# Patient Record
Sex: Male | Born: 1950 | Race: White | Hispanic: No | Marital: Married | Smoking: Former smoker
Health system: Southern US, Community
[De-identification: ages and names within clinical notes are randomized; demographics above are authoritative.]

## PROBLEM LIST (undated history)

## (undated) DIAGNOSIS — E119 Type 2 diabetes mellitus without complications: Secondary | ICD-10-CM

## (undated) DIAGNOSIS — M1990 Unspecified osteoarthritis, unspecified site: Secondary | ICD-10-CM

## (undated) DIAGNOSIS — N400 Enlarged prostate without lower urinary tract symptoms: Secondary | ICD-10-CM

## (undated) DIAGNOSIS — T7840XA Allergy, unspecified, initial encounter: Secondary | ICD-10-CM

## (undated) DIAGNOSIS — R0600 Dyspnea, unspecified: Secondary | ICD-10-CM

## (undated) DIAGNOSIS — F32A Depression, unspecified: Secondary | ICD-10-CM

## (undated) DIAGNOSIS — K829 Disease of gallbladder, unspecified: Secondary | ICD-10-CM

## (undated) DIAGNOSIS — N529 Male erectile dysfunction, unspecified: Secondary | ICD-10-CM

## (undated) DIAGNOSIS — K449 Diaphragmatic hernia without obstruction or gangrene: Secondary | ICD-10-CM

## (undated) DIAGNOSIS — R351 Nocturia: Secondary | ICD-10-CM

## (undated) DIAGNOSIS — F419 Anxiety disorder, unspecified: Secondary | ICD-10-CM

## (undated) DIAGNOSIS — E785 Hyperlipidemia, unspecified: Secondary | ICD-10-CM

## (undated) DIAGNOSIS — J189 Pneumonia, unspecified organism: Secondary | ICD-10-CM

## (undated) DIAGNOSIS — D649 Anemia, unspecified: Secondary | ICD-10-CM

## (undated) DIAGNOSIS — R110 Nausea: Secondary | ICD-10-CM

## (undated) DIAGNOSIS — K219 Gastro-esophageal reflux disease without esophagitis: Secondary | ICD-10-CM

## (undated) DIAGNOSIS — G4700 Insomnia, unspecified: Secondary | ICD-10-CM

## (undated) DIAGNOSIS — R109 Unspecified abdominal pain: Secondary | ICD-10-CM

## (undated) HISTORY — DX: Hyperlipidemia, unspecified: E78.5

## (undated) HISTORY — DX: Allergy, unspecified, initial encounter: T78.40XA

## (undated) HISTORY — DX: Unspecified abdominal pain: R10.9

## (undated) HISTORY — PX: EYE SURGERY: SHX253

## (undated) HISTORY — DX: Insomnia, unspecified: G47.00

## (undated) HISTORY — DX: Anxiety disorder, unspecified: F41.9

## (undated) HISTORY — DX: Diaphragmatic hernia without obstruction or gangrene: K44.9

## (undated) HISTORY — DX: Gastro-esophageal reflux disease without esophagitis: K21.9

---

## 1998-04-16 ENCOUNTER — Ambulatory Visit (HOSPITAL_BASED_OUTPATIENT_CLINIC_OR_DEPARTMENT_OTHER): Admission: RE | Admit: 1998-04-16 | Discharge: 1998-04-16 | Payer: Self-pay | Admitting: Urology

## 1998-10-12 ENCOUNTER — Ambulatory Visit (HOSPITAL_COMMUNITY): Admission: RE | Admit: 1998-10-12 | Discharge: 1998-10-12 | Payer: Self-pay | Admitting: *Deleted

## 1998-10-12 ENCOUNTER — Encounter: Payer: Self-pay | Admitting: *Deleted

## 1998-11-07 ENCOUNTER — Encounter: Admission: RE | Admit: 1998-11-07 | Discharge: 1998-12-26 | Payer: Self-pay | Admitting: Specialist

## 2000-01-16 ENCOUNTER — Ambulatory Visit (HOSPITAL_COMMUNITY): Admission: RE | Admit: 2000-01-16 | Discharge: 2000-01-16 | Payer: Self-pay | Admitting: Gastroenterology

## 2003-03-04 HISTORY — PX: CARDIAC CATHETERIZATION: SHX172

## 2003-09-20 ENCOUNTER — Ambulatory Visit (HOSPITAL_COMMUNITY): Admission: RE | Admit: 2003-09-20 | Discharge: 2003-09-20 | Payer: Self-pay | Admitting: Cardiovascular Disease

## 2004-03-03 HISTORY — PX: TRANSURETHRAL RESECTION OF PROSTATE: SHX73

## 2004-03-03 HISTORY — PX: CHEST TUBE INSERTION: SHX231

## 2004-08-08 ENCOUNTER — Emergency Department (HOSPITAL_COMMUNITY): Admission: EM | Admit: 2004-08-08 | Discharge: 2004-08-08 | Payer: Self-pay | Admitting: Emergency Medicine

## 2004-08-10 ENCOUNTER — Inpatient Hospital Stay (HOSPITAL_COMMUNITY): Admission: EM | Admit: 2004-08-10 | Discharge: 2004-08-19 | Payer: Self-pay | Admitting: Emergency Medicine

## 2005-12-27 IMAGING — CR DG CHEST 2V
2 series · 2 of 2 positions shown · non-contrast
Comparison: none

CLINICAL DATA: Hemothorax. Fractured ribs.
 DBKCN-C VIEW
 Two views of the chest are compared to a chest x-ray from earlier today.  There is little change in opacity at the right lung base, most consistent with atelectasis and effusion with small effusion present as well.  Changes of bronchitis are noted.  Multiple right rib fractures are present.

[w chest pa]
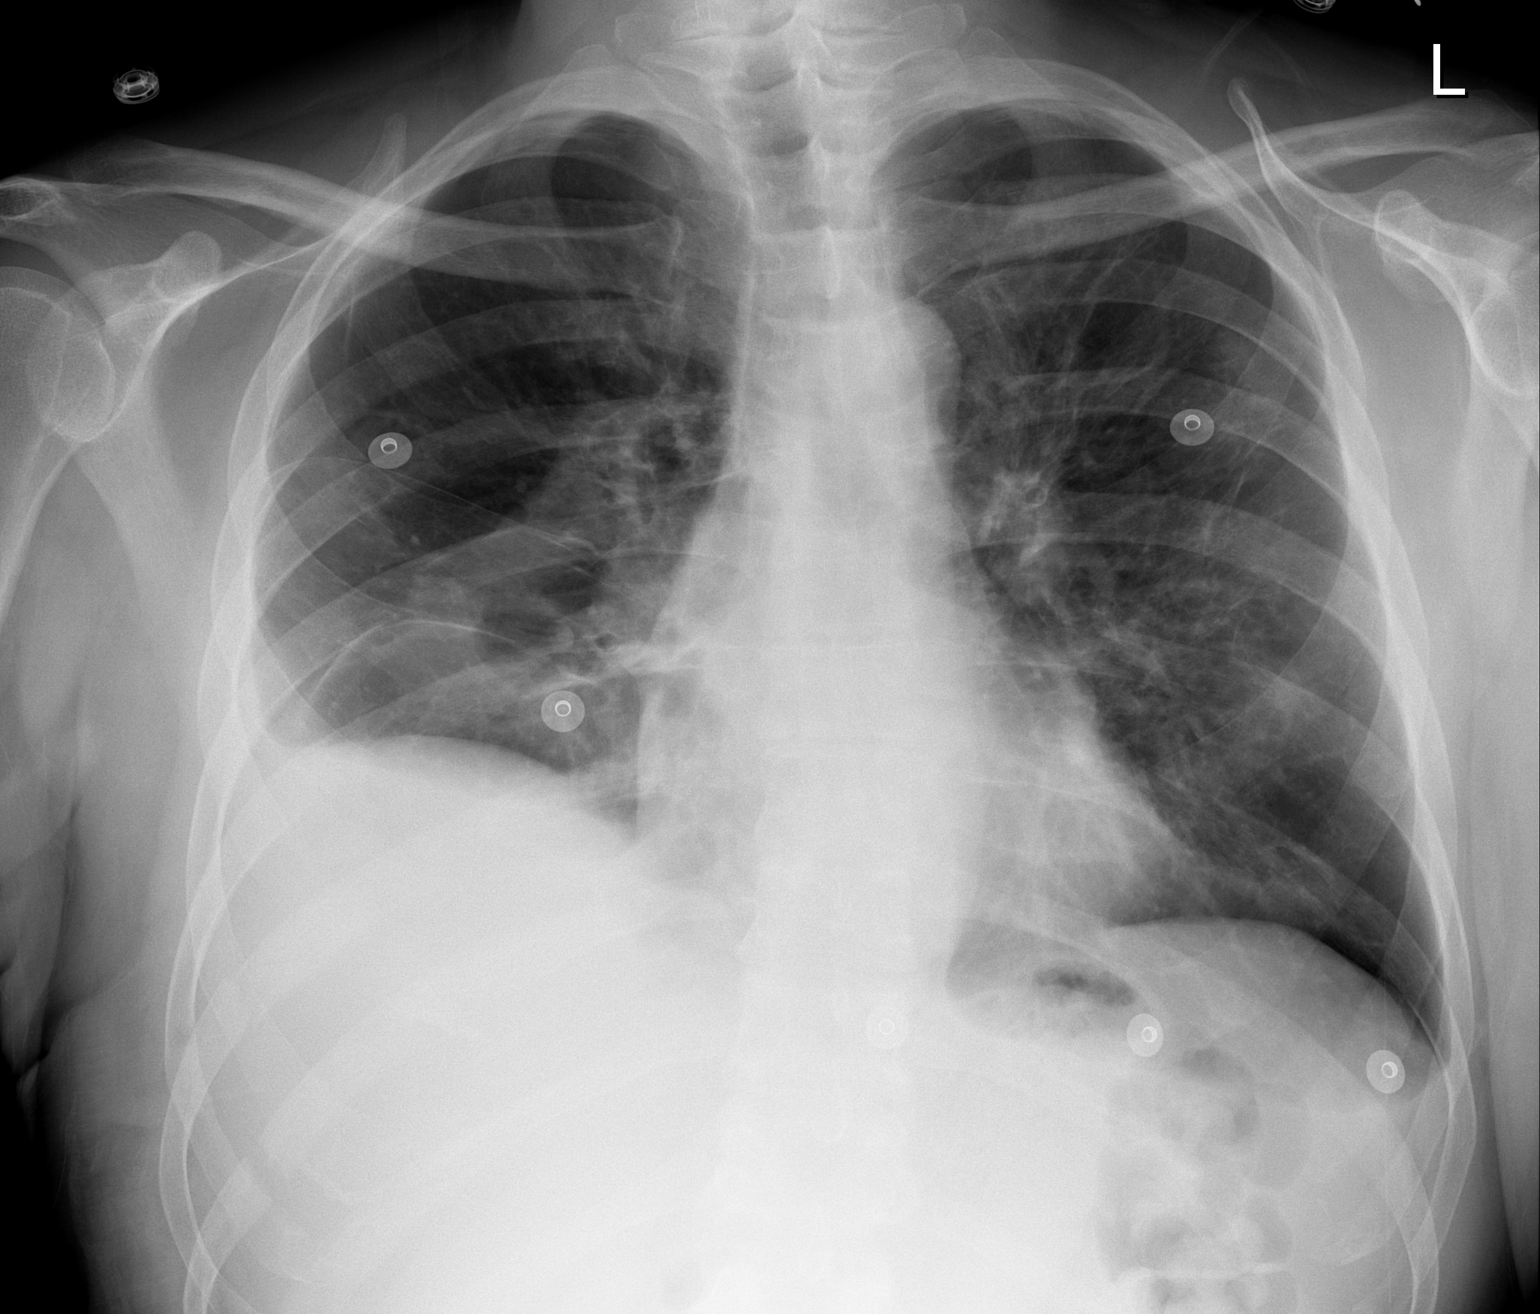

[w chest lat]
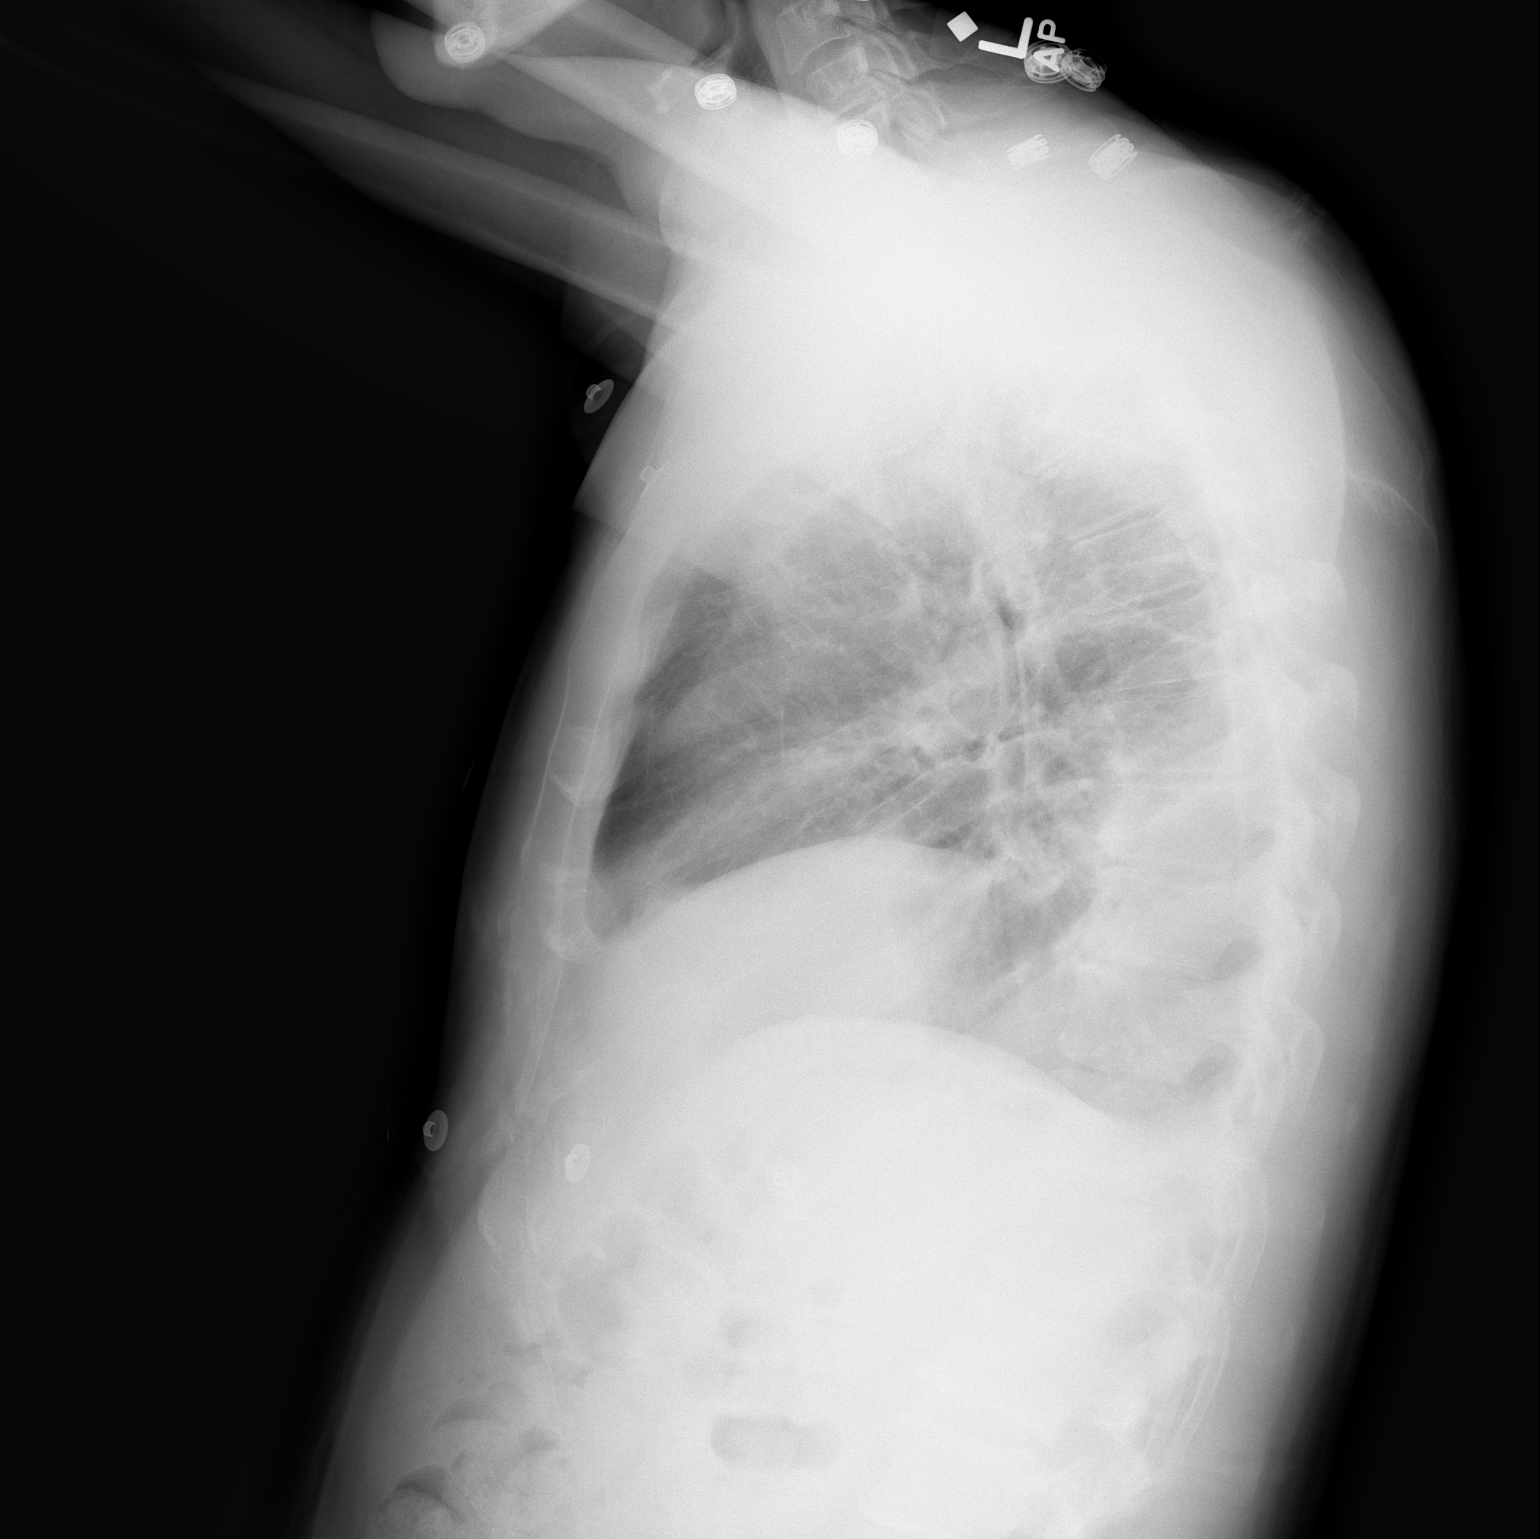

[2 of 2 positions shown; findings below may reference images not displayed]

IMPRESSION: Little change in basilar atelectasis, right greater than left, with small effusions.  Multiple right rib fractures.

## 2008-02-08 ENCOUNTER — Encounter: Admission: RE | Admit: 2008-02-08 | Discharge: 2008-02-08 | Payer: Self-pay | Admitting: Urology

## 2008-02-14 ENCOUNTER — Ambulatory Visit (HOSPITAL_BASED_OUTPATIENT_CLINIC_OR_DEPARTMENT_OTHER): Admission: RE | Admit: 2008-02-14 | Discharge: 2008-02-14 | Payer: Self-pay | Admitting: Urology

## 2008-02-14 ENCOUNTER — Encounter (INDEPENDENT_AMBULATORY_CARE_PROVIDER_SITE_OTHER): Payer: Self-pay | Admitting: Urology

## 2010-07-16 NOTE — Op Note (Signed)
NAME:  Hunter Bridges, Hunter Bridges             ACCOUNT NO.:  [id number]   MEDICAL RECORD NO.:  [id number]          PATIENT TYPE:  AMB   LOCATION:  [facility]                         FACILITY:  [facility]   PHYSICIAN:  Sigmund I. Patsi Sears, M.D.DATE OF BIRTH:  03-07-1950   DATE OF PROCEDURE:  02/14/2008  DATE OF DISCHARGE:                               OPERATIVE REPORT   PREOPERATIVE DIAGNOSIS:  Benign prostate hypertrophy.   POSTOPERATIVE DIAGNOSIS:  Benign prostate hypertrophy.   OPERATIONS:  Cystourethroscopy, transurethral electro-vaporization of  the prostate.   SURGEON:  Sigmund I. Patsi Sears, M.D.   ANESTHESIA:  General LMA.   PREPARATION:  After appropriate preanesthesia, the patient was brought  to the operating room, placed on the operating room in the dorsal supine  position where general LMA anesthesia was introduced.  He was replaced  in the dorsal lithotomy position where the pubis was prepped with  Betadine solution and draped in usual fashion.   REVIEW OF HISTORY:  [name] is a 60 year old male, with a history  of BPH, unresponsive to medical therapy.  The patient was noted to have  a small prostate, and was felt to not be a candidate for office  microwave thermotherapy, and now presents for tissue electro-  vaporization of the prostate.  The prostate size is 27 mL.  Note that  the patient has a preop flow rate of only 3 mL per second.   PROCEDURE:  Cystourethroscopy was accomplished, with visualization of  obstructed prostate from trilobar BPH.  The patient has an elevated  bladder neck, but has no noticeable median lobe.  There was  trabeculation within the prostate, but there is no evidence of bladder  stone, tumor, or bladder diverticular formation.   Electro-vaporization resection was accomplished in the 7 o'clock to the  5 o'clock positions, from the 10 o'clock to the 7 o'clock position, and  from the 2 o'clock to the 5 o'clock positions.  There was minimal  bleeding  noted.  Cauterization of the base of the prostate was also  accomplished with the electro-vaporizing electrode.  The verumontanum  was maintained in position for the entire procedure.  At 24 Ainsworth  catheter was placed, with clear irrigant.  The patient was awakened and  taken to the recovery room in good condition.      Sigmund I. Patsi Sears, M.D.  Electronically Signed     SIT/MEDQ  D:  02/14/2008  T:  02/15/2008  Job:  [id number]

## 2010-07-19 NOTE — Cardiovascular Report (Signed)
NAME:  Hunter Bridges, Hunter Bridges                       ACCOUNT NO.:  [id number]   MEDICAL RECORD NO.:  [id number]                   PATIENT TYPE:  OIB   LOCATION:  2899                                 FACILITY:  [facility]   PHYSICIAN:  Vesta Mixer, M.D.              DATE OF BIRTH:  1950/12/31   DATE OF PROCEDURE:  09/20/2003  DATE OF DISCHARGE:                              CARDIAC CATHETERIZATION   [name] is a 60 year old gentleman with a history of chest pain and  chest tightness.  He had a stress Cardiolite study which revealed small  amount of ischemia in the inferolateral wall.  He was scheduled for heart  catheterization for further evaluation.   PROCEDURE:  Left heart catheterization with coronary angiography.   The right femoral artery was easily cannulated using the modified Seldinger  technique.   HEMODYNAMICS:  The left ventricular pressure was 108/11 with an aortic  pressure of 108/60.   CORONARY ANGIOGRAPHY:  1. Left main coronary artery is smooth and normal.  2. The left anterior descending artery is smooth and normal.  There are     several small diagonal vessels which are normal.  3. The left circumflex artery is smooth and normal.  There is obtuse     marginal artery that is moderately sized.  It is smooth and normal.  4. The right coronary artery is large and dominant.  It is smooth and normal     throughout its course.  The posterior descending artery and posterior     lateral segment artery are normal.   LEFT VENTRICULOGRAM:  Left ventriculogram was performed in a 30-RAO  position.  It reveals normal left ventricular systolic function.  The  ejection fraction is 60-65%.   COMPLICATIONS:  None.   CONCLUSIONS:  1. Smooth and normal coronary arteries.  2. Normal left ventricular systolic function.                                               Vesta Mixer, M.D.    PJN/MEDQ  D:  09/20/2003  T:  09/20/2003  Job:  [id number]   cc:   Jeannett Senior A. Evlyn Kanner,  M.D.  [address]  [place]  [place] [address]  Fax: [phone]

## 2010-07-19 NOTE — Procedures (Signed)
[place]. [facility]  Patient:    Hunter Bridges, Hunter Bridges                    MRN: [id number] Proc. Date: 01/16/00 Adm. Date:  [id number] Attending:  Charna Elizabeth CC:         Tera Mater. Evlyn Kanner, M.D.   Procedure Report  DATE OF BIRTH: [birth year]  PROCEDURE: Esophagogastroduodenoscopy.  ENDOSCOPIST: Anselmo Rod, M.D.  INSTRUMENT USED: Olympus video panendoscope.  INDICATIONS FOR PROCEDURE: Epigastric pain with rectal bleeding and family history of stomach cancer in a 61 year old white male, rule out peptic ulcer disease, esophagitis, gastritis, etc.  PREPROCEDURE PREPARATION: Informed consent was procured from the patient and the patient was fasted for eight hours prior to the procedure.  PHYSICAL EXAMINATION:  VITAL SIGNS: The patient has stable vital signs.  NECK: Supple.  CHEST: Clear to auscultation.  HEART: S1 and S2, regular.  ABDOMEN: Soft, normal abdominal bowel sounds.  DESCRIPTION OF PROCEDURE: The patient was placed in the left lateral decubitus position and sedated with 40 mg of Demerol and 4 mg of Versed intravenously. Once the patient was adequately sedated and maintained on low-flow oxygen with continuous cardiac monitoring the Olympus video panendoscope was advanced through the mouthpiece and over the tongue into the esophagus under direct vision.  The entire esophagus appeared normal without evidence of ring, stricture, masses, lesions, esophagitis, or Barretts mucosa.  The endoscope was then advanced into the stomach.  A small hiatal hernia was seen on retroflexion in the high cardia.  There was active reflux of a small amount of gastric fluid into the distal esophagus.  No erosions, ulcerations, masses, or polyps were seen in the duodenal bulb and small bowel appeared normal.  IMPRESSION: Essentially normal esophagogastroduodenoscopy except for a small hiatal hernia.  RECOMMENDATIONS: Proceed with colonoscopy at this  time. DD:  01/16/00 TD:  01/17/00 Job: [id number]

## 2010-07-19 NOTE — H&P (Signed)
NAME:  Hunter Bridges, Hunter Bridges NO.:  [id number]   MEDICAL RECORD NO.:  [id number]          PATIENT TYPE:  INP   LOCATION:  1824                         FACILITY:  [facility]   PHYSICIAN:  Sharlet Salina T. Hoxworth, M.D.DATE OF BIRTH:  January 31, 1951   DATE OF ADMISSION:  08/10/2004  DATE OF DISCHARGE:                                HISTORY & PHYSICAL   CHIEF COMPLAINT:  Right chest pain and shortness of breath.   HISTORY OF PRESENT ILLNESS:  Hunter Bridges is a 60 year old male who three  days ago developed a GI flu-like syndrome with nausea, vomiting, and  diarrhea. Two days ago he experienced nausea, lightheadedness, and blacked  out in a bathroom falling, striking his back and right side. He awoke  immediately and had some pain in his right shoulder and right chest. He  presented at that time to the [facility] emergency room and evaluation  including a chest x-ray was negative. He was treated symptomatically.  Today, however, he again had some nausea, stood up, and felt lightheaded and  again blacked out. He presented to the emergency room for evaluation. He is  complaining of some continued pain in his right chest, somewhat worse today,  and experiencing some shortness of breath with pain in the right chest and  right shoulder with deep inspiration. Denies any head or neck pain, or  abdominal pain. He does have a history of some vagal reactions and  lightheadedness on standing over some period of time. He is diabetic.   PAST MEDICAL HISTORY:  1.  Treated for adult onset diabetes mellitus, oral agents, controlled.  2.  GERD.  3.  Allergic sinusitis.  4.  Anxiety.   PAST SURGICAL HISTORY:  None.   MEDICATIONS:  1.  Claritin daily.  2.  Avandia 2 mg daily.  3.  AcipHex daily.  4.  Cholesterol agent, unknown type, combination agent daily.  5.  Xanax 0.25 mg one or two daily p.r.n.  6.  Ambien at night p.r.n.   ALLERGIES:  PENICILLIN which causes a rash.   SOCIAL HISTORY:   He works for [employer]. He is married. He does not  smoke cigarettes or drink alcohol.   FAMILY HISTORY:  Father died of perforated ulcer at an early age. Mother  died of COPD and heart disease.   REVIEW OF SYSTEMS:  GENERAL: Positive for some weakness and malaise with  this illness. RESPIRATORY: Positive as above. CARDIAC: History of a negative  cardiac catheterization two to three years ago. GI: He has had some  intermittent nausea and diarrhea over the last several days and chronic  complaints. GU: Negative. NEUROLOGIC: Positive for occasional anxiety.  ENDOCRINE: Positive for diabetes.   PHYSICAL EXAMINATION:  VITAL SIGNS: Pulse 118, blood pressure 112/60,  respirations 18, temperature 99.4. O2 saturation is 100% on two liters.  GENERAL: A well-developed male, somewhat pale.  SKIN: Warm.  HEENT: No masses, atraumatic.  NECK: Nontender, full range of motion without pain.  CHEST: Decreased breath sounds on the right. No increased work of breathing.  No crepitus. Some slight right posterior chest wall tenderness without  bruising.  CARDIAC: Regular tachycardia. No edema. Pulse intact.  ABDOMEN: Flat, soft, nontender. No masses or organomegaly.  EXTREMITIES: No joint pain, swelling, tenderness, or deformity.  NEUROLOGIC: Alert and oriented. Motor and sensory exam grossly norma.   LABORATORY DATA:  Electrolytes normal. Glucose 192.  Hemoglobin 11.2 and was  14 two days ago. White count 8000. Urinalysis negative.   CT scan of the chest and abdomen is obtained. This shows fractures of the  right 9th and 10th ribs, and a moderate to large right hemothorax.  There is  also a small grade 1 posterior liver laceration with minimal blood and no  hemoperitoneum.   ASSESSMENT/PLAN:  A 60 year old male with syncopal episode and fall likely  related to vasovagal reaction exacerbated by probable viral syndrome and  possible autonomic dysfunction with diabetes. A fall two days ago  likely  resulted in rib fractures and slow accumulation of right hemothorax. Also  has a small right liver laceration with minimal bleeding.   Sterile technique in the emergency room, a 36 right chest tube was placed  with about 1400 cc of old blood returned.   The patient will be admitted to the trauma service for close observation.       BTH/MEDQ  D:  08/10/2004  T:  08/10/2004  Job:  [id number]

## 2010-07-19 NOTE — H&P (Signed)
NAME:  Hunter, Bridges                       ACCOUNT NO.:  [id number]   MEDICAL RECORD NO.:  [id number]                   PATIENT TYPE:  OIB   LOCATION:                                       FACILITY:  [facility]   PHYSICIAN:  Vesta Mixer, M.D.              DATE OF BIRTH:  24-Jul-1950   DATE OF ADMISSION:  09/20/2003  DATE OF DISCHARGE:                                HISTORY & PHYSICAL   HISTORY OF PRESENT ILLNESS:  Hunter Bridges is a 60 year old gentleman with  a history of borderline diabetes mellitus, hypertriglyceridemia and an  enlarged prostate.  He is admitted to the hospital after having an abnormal  stress Cardiolite study.   Hunter Bridges has had some episodes of chest pain and chest discomfort for  the past several weeks.  He recently had a stress Cardiolite study, which  revealed evidence of inferolateral ischemia.  He had normal left ventricular  systolic function.  He is admitted now for follow up heart catheterization.   CURRENT MEDICATIONS:  1. Ambien q.h.s.  2. Flonase once a day.  3. Vytorin 10 mg/80 mg once a day.  4. Xanax 0.5 mg as needed.  5. Avandia 4 mg a day.  6. Aciphex 20 mg a day.  7. Elmiron 100 mg a day.  8. Flomax once a day.  9. Vitamins once a day.   ALLERGIES:  He is allergic to penicillin.   PAST MEDICAL HISTORY:  1. History of hypertriglyceridemia.  2. Borderline diabetes mellitus.  3. Enlarged prostate.   SOCIAL HISTORY:  The patient used to smoke but quit 16 years ago.  He works  for a [employer].  He walks fairly regularly.  He drinks alcohol only  rarely.   FAMILY HISTORY:  His father died at age 29 due to a perforated ulcer.  His  mother is 49 years old and is still relatively healthy.  She does have a  history of hypertension.   REVIEW OF SYSTEMS:  His review of systems was reviewed and is essentially  negative.   PHYSICAL EXAMINATION:  GENERAL:  He is a middle-aged gentleman in no acute  distress.  He is alert and  oriented x 3 and his mood and affect were normal.  VITAL SIGNS:  His weight is 171, his blood pressure is 120/80 with a heart  rate of 100.  HEENT:  Reveals 2+ carotids.  He has no bruits.  NECK:  There is no JVD, no thyromegaly.  LUNGS:  Clear to auscultation.  HEART:  Regular rate.  S1, S2 with no murmurs, gallops or rubs.  ABDOMINAL:  Good bowel sounds.  Nontender.  EXTREMITIES:  No clubbing, cyanosis or edema.  NEURO:  Nonfocal.   Hunter Bridges presents with some episodes of chest discomfort.  He had  a  stress Cardiolite study, which revealed inferolateral ischemia.  We have  discussed these results and he has agreed  to have a heart catheterization.  We have discussed the risks, benefits and options of heart catheterization.  He understands and agrees to proceed.                                                Vesta Mixer, M.D.    PJN/MEDQ  D:  09/17/2003  T:  09/17/2003  Job:  [id number]   cc:   Jeannett Senior A. Evlyn Kanner, M.D.  [address]  [place]  [place] [address]  Fax: [phone]

## 2010-07-19 NOTE — Discharge Summary (Signed)
NAMEKELL, [name] NO.:  [id number]   MEDICAL RECORD NO.:  [id number]          PATIENT TYPE:  INP   LOCATION:  5740                         FACILITY:  [facility]   PHYSICIAN:  Earney Hamburg, P.A.  DATE OF BIRTH:  01/16/1951   DATE OF ADMISSION:  08/10/2004  DATE OF DISCHARGE:  08/19/2004                                 DISCHARGE SUMMARY   DISCHARGE DIAGNOSES:  1.  Fall.  2.  Grade 1 liver laceration.  3.  Right hemothorax.  4.  Rib fractures, right side 9 and 10.  5.  Syncope.  6.  Acute blood loss anemia.  7.  Diabetes mellitus.  8.  Gastroesophageal reflux disease.  9.  Hypercholesterolemia.  10. Nosocomial pneumonia.  11. Allergic rhinitis.  12. Right pneumothorax.   CONSULTANTS:  None.   PROCEDURES:  Thoracostomy with chest tube placement.   HISTORY OF PRESENT ILLNESS:  Hunter Bridges is a 60 year old white male who  developed a GI-like syndrome with nausea, vomiting, and diarrhea.  The day  after that, he fell in a bathroom striking his back and right side.  He  believes that he lost consciousness during this fall.  He went to the ER at  that point and was evaluated with a chest x-ray and treated symptomatically.  However, he had another episode where he stood up and blacked out and came  back in the emergency room.  He had a more thorough workup at this point  which included a CT of the abdomen and chest.  This showed a fracture of the  9th and 10th ribs, moderate to large right hemothorax, and a small grade 1  posterior liver laceration with no hemoperitoneum.  He was admitted for  evacuation of the hemothorax and observation.   HOSPITAL COURSE:  The patient had a somewhat slow recovery from the  standpoint of his chest tube.  He had waxing and waning pneumothoraces on  his chest x-rays and fairly large outputs throughout his course.  However,  he seemed to turn a corner at approximately hospital day #6 and was able to  have his chest tube taken  out on hospital day #9.  His chest x-ray remained  stable at this point without any reaccumulation of his hemothorax or any  significant pneumothorax.  About midway in his hospital course, he had some  problems with significant tachycardia with a heart rate up in the 140s and  150s and some symptomatic shortness of breath.  Repeat chest CT at this  point was negative for any pulmonary embolism, and his tachycardia remained  unexplained although it did resolve on its own.  He did develop an apparent  pneumonia on chest x-ray, approximately hospital day #4 or 5 and was started  on Avelox and Diflucan.  Sputum cultures showed no growth at the time of  discharge.  He remained asymptomatic from that.  He did not have any further  syncopal episodes while in the hospital nor did he have any significant GI  distress and felt like he was back to his normal self at the time of  discharge  on hospital day #10.  He was discharged home in good condition in  the company of his wife.   DISCHARGE MEDICATIONS:  1.  Percocet 5/325 take one to two p.o. q.4h. p.r.n. pain, #50, with no      refill.  2.  Avandia 2 mg take one tablet daily.  3.  Zocor 20 mg take one tablet daily.  4.  AcipHex 20 mg take one tablet daily.  5.  Xanax 0.25 mg take one tablet every 12 hours as needed for anxiety, #60      with no refill.  6.  Ferrous sulfate 325, take one tablet three times daily.  7.  Claritin 10 mg take one tablet daily as needed for allergies.  8.  Avelox 400 mg take one tablet daily until gone, #6 with no refill.   FOLLOW-UP:  The patient was to follow up in the trauma service clinic on  July 11.  He is to call his primary physician for an appointment as soon as  possible for follow-up on his hypercholesterolemia, reflex, anxiety, anemia,  allergies, diabetes, and his syncope.  If he has any questions or concerns  prior to that, he will call.       MJ/MEDQ  D:  08/19/2004  T:  08/19/2004  Job:  [id number]

## 2010-07-19 NOTE — Procedures (Signed)
[place]. [facility]  Patient:    Hunter Bridges, Hunter Bridges                    MRN: [id number] Proc. Date: 01/16/00 Adm. Date:  [id number] Attending:  Charna Elizabeth CC:         Tera Mater. Evlyn Kanner, M.D.   Procedure Report  DATE OF BIRTH: Jul 27, 1950  PROCEDURE: Colonoscopy.  ENDOSCOPIST: Anselmo Rod, M.D.  INSTRUMENT USED: Olympus video colonoscope.  INDICATIONS FOR PROCEDURE: Rectal bleeding in a 60 year old white male, rule out colonic polyps, masses, hemorrhoids, etc.  PREPROCEDURE PREPARATION: Informed consent was procured from the patient and the patient was fasted for eight hours prior to the procedure.  He was prepped with a bottle of magnesium citrate and a gallon on NuLytely the night prior to the procedure.  PHYSICAL EXAMINATION:  VITAL SIGNS: The patient had stable vital signs.  NECK: Supple.  CHEST: Clear to auscultation.  HEART: S1 and S2, regular.  ABDOMEN: Soft, with normal abdominal bowel sounds.  DESCRIPTION OF PROCEDURE: The patient was placed in the left lateral decubitus position and sedated with an additional 10 mg of Demerol and 1 mg of Versed intravenously.  Once the patient was adequately sedated and maintained on low-flow oxygen and with continuous cardiac monitoring the Olympus video colonoscope was advanced from the rectum to the cecum, with slight difficulty secondary to some residual stool in the right colon.  The patient had a few left-sided diverticula.  There were small nonbleeding internal hemorrhoids seen on retroflexion in the rectum.  No masses or polyps were seen.  A very small lesion may have been missed secondary to the relatively poor prep, especially in the right colon.  IMPRESSION:  1. Left-sided diverticulosis.  2. Small nonbleeding internal hemorrhoids.  3. No masses or polyps seen.  4. Some residual stool noted in the right colon.  RECOMMENDATIONS:  1. The patient has been advised to increase  fluids and the fiber in his diet.  2. Outpatient follow-up is advised in the next two weeks. DD:  01/16/00 TD:  01/17/00 Job: [id number]

## 2010-12-06 LAB — BASIC METABOLIC PANEL
Chloride: 102 mEq/L (ref 96–112)
GFR calc non Af Amer: >60 mL/min (ref >60)
Potassium: 4.1 mEq/L (ref 3.5–5.1)
Sodium: 138 mEq/L (ref 135–145)

## 2010-12-06 LAB — POCT I-STAT 4, (NA,K, GLUC, HGB,HCT)
Glucose, Bld: 116 mg/dL — ABNORMAL HIGH (ref 70–99)
Hemoglobin: 14.3 g/dL (ref 13.0–17.0)
Sodium: 139 mEq/L (ref 135–145)

## 2011-01-22 ENCOUNTER — Other Ambulatory Visit: Payer: Self-pay | Admitting: Ophthalmology

## 2011-01-22 DIAGNOSIS — H5005 Alternating esotropia: Secondary | ICD-10-CM

## 2011-01-30 ENCOUNTER — Other Ambulatory Visit: Payer: Self-pay

## 2011-01-30 ENCOUNTER — Ambulatory Visit
Admission: RE | Admit: 2011-01-30 | Discharge: 2011-01-30 | Disposition: A | Discharge status: Home / Self Care | Payer: BC Managed Care – PPO | Source: Ambulatory Visit | Attending: Ophthalmology | Admitting: Ophthalmology

## 2011-01-30 DIAGNOSIS — H5005 Alternating esotropia: Secondary | ICD-10-CM

## 2011-01-30 MED ORDER — GADOBENATE DIMEGLUMINE 529 MG/ML IV SOLN
15.0000 mL | Freq: Once | INTRAVENOUS | Status: AC | PRN
  Administered 2011-01-30: 15 mL via INTRAVENOUS

## 2011-03-04 HISTORY — PX: COLONOSCOPY: SHX174

## 2011-11-12 ENCOUNTER — Other Ambulatory Visit: Payer: Self-pay | Admitting: Gastroenterology

## 2011-11-12 DIAGNOSIS — R110 Nausea: Secondary | ICD-10-CM

## 2011-11-12 DIAGNOSIS — R1013 Epigastric pain: Secondary | ICD-10-CM

## 2011-11-19 ENCOUNTER — Other Ambulatory Visit (HOSPITAL_COMMUNITY): Payer: BC Managed Care – PPO

## 2011-11-24 ENCOUNTER — Encounter (HOSPITAL_COMMUNITY)
Admission: RE | Admit: 2011-11-24 | Discharge: 2011-11-24 | Disposition: A | Discharge status: Home / Self Care | Payer: BC Managed Care – PPO | Source: Ambulatory Visit | Attending: Gastroenterology | Admitting: Gastroenterology

## 2011-11-24 DIAGNOSIS — R1013 Epigastric pain: Secondary | ICD-10-CM | POA: Insufficient documentation

## 2011-11-24 DIAGNOSIS — R110 Nausea: Secondary | ICD-10-CM | POA: Insufficient documentation

## 2011-11-24 MED ORDER — TECHNETIUM TC 99M SULFUR COLLOID
2.0000 | Freq: Once | INTRAVENOUS | Status: AC | PRN
  Administered 2011-11-24: 2 via INTRAVENOUS

## 2011-11-26 ENCOUNTER — Other Ambulatory Visit: Payer: Self-pay | Admitting: Gastroenterology

## 2011-11-26 DIAGNOSIS — R1013 Epigastric pain: Secondary | ICD-10-CM

## 2011-11-26 DIAGNOSIS — R110 Nausea: Secondary | ICD-10-CM

## 2011-12-10 ENCOUNTER — Ambulatory Visit (HOSPITAL_COMMUNITY)
Admission: RE | Admit: 2011-12-10 | Discharge: 2011-12-10 | Disposition: A | Discharge status: Home / Self Care | Payer: BC Managed Care – PPO | Source: Ambulatory Visit | Attending: Gastroenterology | Admitting: Gastroenterology

## 2011-12-10 ENCOUNTER — Encounter (HOSPITAL_COMMUNITY)
Admission: RE | Admit: 2011-12-10 | Discharge: 2011-12-10 | Disposition: A | Discharge status: Home / Self Care | Payer: BC Managed Care – PPO | Source: Ambulatory Visit | Attending: Gastroenterology | Admitting: Gastroenterology

## 2011-12-10 DIAGNOSIS — R1013 Epigastric pain: Secondary | ICD-10-CM | POA: Insufficient documentation

## 2011-12-10 DIAGNOSIS — R110 Nausea: Secondary | ICD-10-CM

## 2011-12-10 MED ORDER — TECHNETIUM TC 99M MEBROFENIN IV KIT
5.0000 | PACK | Freq: Once | INTRAVENOUS | Status: AC | PRN
  Administered 2011-12-10: 5 via INTRAVENOUS

## 2011-12-10 NOTE — Progress Notes (Addendum)
Kinevac 1.4 ug Kinevac mixed in 50 cc NS.  Administered at 112ml/hr for 50 cc for Hepato EF in Nuc Med.

## 2011-12-15 ENCOUNTER — Encounter (INDEPENDENT_AMBULATORY_CARE_PROVIDER_SITE_OTHER): Payer: Self-pay | Admitting: Surgery

## 2012-01-01 ENCOUNTER — Encounter (INDEPENDENT_AMBULATORY_CARE_PROVIDER_SITE_OTHER): Payer: Self-pay | Admitting: Surgery

## 2012-01-01 ENCOUNTER — Ambulatory Visit (INDEPENDENT_AMBULATORY_CARE_PROVIDER_SITE_OTHER): Payer: BC Managed Care – PPO | Admitting: Surgery

## 2012-01-01 VITALS — BP 112/70 | HR 96 | Temp 96.6°F | Resp 18 | Ht 69.5 in | Wt 151.6 lb

## 2012-01-01 DIAGNOSIS — K828 Other specified diseases of gallbladder: Secondary | ICD-10-CM | POA: Insufficient documentation

## 2012-01-01 NOTE — Progress Notes (Signed)
Patient ID: Hunter Bridges, male   DOB: 03-25-1950, 61 y.o.   MRN: [id number]  Chief Complaint  Patient presents with  . Other    biliary dyskinesia    HPI Hunter Bridges is a 61 y.o. male.   HPI This gentleman is referred by Dr. Loreta Ave. He has a several month to several year history of vague abdominal discomfort. He reports pain and nausea after fatty meals. He is now having constipation. Occasionally the pain through to the back. The pain is also periumbilical. It is mild to moderate in nature. He denies emesis. He is now constipated. He has had a thorough workup including upper endoscopy, lower endoscopy, capsule endoscopy, ultrasound, and HIDA scan. Past Medical History  Diagnosis Date  . Insomnia   . Allergy   . Anxiety   . Hyperlipidemia   . GERD (gastroesophageal reflux disease)   . Hiatal hernia   . Abdominal pain     Past Surgical History  Procedure Date  . Eye surgery 2012  . Colonoscopy 2013    Family History  Problem Relation Age of Onset  . Cancer Mother     colon    Social History History  Substance Use Topics  . Smoking status: Former Games developer  . Smokeless tobacco: Not on file  . Alcohol Use: No    Allergies  Allergen Reactions  . Penicillins Rash    Current Outpatient Prescriptions  Medication Sig Dispense Refill  . ALPRAZolam (XANAX) 0.5 MG tablet       . azithromycin (ZITHROMAX) 250 MG tablet       . DEXILANT 60 MG capsule       . ELMIRON 100 MG capsule       . fluticasone (FLONASE) 50 MCG/ACT nasal spray       . JENTADUETO 2.5-850 MG TABS       . LOVAZA 1 G capsule       . meloxicam (MOBIC) 15 MG tablet       . NIASPAN 500 MG CR tablet       . pioglitazone-metformin (ACTOPLUS MET) 15-850 MG per tablet       . simvastatin (ZOCOR) 80 MG tablet       . VESICARE 10 MG tablet       . VOLTAREN 1 % GEL       . zolpidem (AMBIEN) 10 MG tablet       . ZUPLENZ 8 MG FILM         Review of Systems Review of Systems  Constitutional: Positive  for unexpected weight change. Negative for fever and chills.  HENT: Negative for hearing loss, congestion, sore throat, trouble swallowing and voice change.   Eyes: Negative for visual disturbance.  Respiratory: Negative for cough and wheezing.   Cardiovascular: Negative for chest pain, palpitations and leg swelling.  Gastrointestinal: Positive for nausea, abdominal pain, constipation and abdominal distention. Negative for vomiting, diarrhea, blood in stool, anal bleeding and rectal pain.  Genitourinary: Negative for hematuria and difficulty urinating.  Musculoskeletal: Negative for arthralgias.  Skin: Negative for rash and wound.  Neurological: Positive for weakness. Negative for seizures, syncope and headaches.  Hematological: Negative for adenopathy. Does not bruise/bleed easily.  Psychiatric/Behavioral: Negative for confusion.    Blood pressure 112/70, pulse 96, temperature 96.6 F (35.9 C), temperature source Temporal, resp. rate 18, height 5' 9.5" (1.765 m), weight 151 lb 9.6 oz (68.765 kg).  Physical Exam Physical Exam  Constitutional: He is oriented to person, place, and time.  He appears well-developed and well-nourished. No distress.  HENT:  Head: Normocephalic and atraumatic.  Right Ear: External ear normal.  Left Ear: External ear normal.  Nose: Nose normal.  Mouth/Throat: Oropharynx is clear and moist. No oropharyngeal exudate.  Eyes: Conjunctivae normal are normal. Pupils are equal, round, and reactive to light. Right eye exhibits no discharge. Left eye exhibits no discharge. No scleral icterus.  Neck: Normal range of motion. Neck supple. No tracheal deviation present. No thyromegaly present.  Cardiovascular: Normal rate, regular rhythm, normal heart sounds and intact distal pulses.   No murmur heard. Pulmonary/Chest: Effort normal and breath sounds normal. No respiratory distress. He has no wheezes. He has no rales.  Abdominal: Soft. Bowel sounds are normal. He exhibits  no distension. There is no tenderness. There is no rebound.  Musculoskeletal: Normal range of motion. He exhibits no edema and no tenderness.  Lymphadenopathy:    He has no cervical adenopathy.  Neurological: He is alert and oriented to person, place, and time.  Skin: Skin is warm and dry. No rash noted. He is not diaphoretic. No erythema.  Psychiatric: His behavior is normal. Judgment normal.    Data Reviewed Ultrasound is unremarkable. Upper and lower endoscopy reports were also normal. HIDA  shows a 34% all bladder ejection fraction   Assessment    Biliary dyskinesia    Plan    I had a long discussion with the patient. He is eager to proceed with laparoscopic cholecystectomy. Again I am uncertain whether this is a total cause of his symptoms. Given his negative workup, I believe it is however reasonable and I can do a diagnostic laparoscopy at the same time. I discussed the risk of surgery which includes but is not limited to bleeding, infection, bowel duct injury, bile leak, injury to other structures, the need to convert to an open procedure, and the chance this may not resolve any of his symptoms. He understands and wishes to proceed. Surgery will be scheduled.       , A 01/01/2012, 3:59 PM

## 2012-01-06 ENCOUNTER — Encounter (HOSPITAL_COMMUNITY): Payer: Self-pay | Admitting: Pharmacy Technician

## 2012-01-08 ENCOUNTER — Encounter (HOSPITAL_COMMUNITY)
Admission: RE | Admit: 2012-01-08 | Discharge: 2012-01-08 | Disposition: A | Discharge status: Home / Self Care | Payer: BC Managed Care – PPO | Source: Ambulatory Visit | Attending: Surgery | Admitting: Surgery

## 2012-01-08 ENCOUNTER — Encounter (HOSPITAL_COMMUNITY): Payer: Self-pay

## 2012-01-08 ENCOUNTER — Ambulatory Visit (HOSPITAL_COMMUNITY)
Admission: RE | Admit: 2012-01-08 | Discharge: 2012-01-08 | Disposition: A | Discharge status: Home / Self Care | Payer: BC Managed Care – PPO | Source: Ambulatory Visit | Attending: Surgery | Admitting: Surgery

## 2012-01-08 DIAGNOSIS — Z01818 Encounter for other preprocedural examination: Secondary | ICD-10-CM | POA: Insufficient documentation

## 2012-01-08 DIAGNOSIS — E119 Type 2 diabetes mellitus without complications: Secondary | ICD-10-CM | POA: Insufficient documentation

## 2012-01-08 HISTORY — DX: Anemia, unspecified: D64.9

## 2012-01-08 HISTORY — DX: Nocturia: R35.1

## 2012-01-08 HISTORY — DX: Benign prostatic hyperplasia without lower urinary tract symptoms: N40.0

## 2012-01-08 HISTORY — DX: Unspecified osteoarthritis, unspecified site: M19.90

## 2012-01-08 HISTORY — DX: Disease of gallbladder, unspecified: K82.9

## 2012-01-08 HISTORY — DX: Type 2 diabetes mellitus without complications: E11.9

## 2012-01-08 HISTORY — DX: Nausea: R11.0

## 2012-01-08 LAB — BASIC METABOLIC PANEL
BUN: 10 mg/dL (ref 6–23)
Chloride: 97 mEq/L (ref 96–112)
Creatinine, Ser: 0.78 mg/dL (ref 0.50–1.35)
GFR calc Af Amer: >90 mL/min (ref >90)
GFR calc non Af Amer: >90 mL/min (ref >90)
Glucose, Bld: 102 mg/dL — ABNORMAL HIGH (ref 70–99)
Potassium: 4.8 mEq/L (ref 3.5–5.1)

## 2012-01-08 LAB — CBC
HCT: 38.7 % — ABNORMAL LOW (ref 39.0–52.0)
Hemoglobin: 13.3 g/dL (ref 13.0–17.0)
MCHC: 34.4 g/dL (ref 30.0–36.0)
MCV: 85.2 fL (ref 78.0–100.0)
RDW: 13.3 % (ref 11.5–15.5)

## 2012-01-08 NOTE — Patient Instructions (Signed)
[facility]  01/08/2012                           YOUR PROCEDURE IS SCHEDULED ON: 01/12/12 AT 10:30 AM               PLEASE REPORT TO SHORT STAY CENTER AT :  8:00 AM               CALL THIS NUMBER IF ANY PROBLEMS THE DAY OF SURGERY :               [phone]                      REMEMBER:   Do not eat food or drink liquids AFTER MIDNIGHT   Take these medicines the morning of surgery with A SIP OF WATER: DEXILANT / ELMIRON / CLARITIN / SIMVASTATIN / VESICARE / Prudy Feeler / FLONASE / EYE DROPS IF NEEDED)   Do not wear jewelry, make-up   Do not wear lotions, powders, or perfumes.   Do not shave legs or underarms 12 hrs. before surgery (men may shave face)  Do not bring valuables to the hospital.  Contacts, dentures or bridgework may not be worn into surgery.  Leave suitcase in the car. After surgery it may be brought to your room.  For patients admitted to the hospital more than one night, checkout time is 11:00                          The day of discharge.   Patients discharged the day of surgery will not be allowed to drive home                             If going home same day of surgery, must have someone stay with you first                           24 hrs at home and arrange for some one to drive you home from hospital.    Special Instructions:   Please read over the following fact sheets that you were given:               1. MRSA  INFORMATION                      2. [facility] PREPARING FOR SURGERY SHEET                                                X_____________________________________________________________________

## 2012-01-08 NOTE — Progress Notes (Signed)
01/08/12 0958  OBSTRUCTIVE SLEEP APNEA  Have you ever been diagnosed with sleep apnea through a sleep study? No  Do you snore loudly (loud enough to be heard through closed doors)?  1  Do you often feel tired, fatigued, or sleepy during the daytime? 1  Has anyone observed you stop breathing during your sleep? 1  Do you have, or are you being treated for high blood pressure? 0  BMI more than 35 kg/m2? 0  Age over 63 years old? 1  Neck circumference greater than 40 cm/18 inches? 0  Gender: 1  Obstructive Sleep Apnea Score 5   Score 4 or greater  Results sent to PCP

## 2012-01-11 NOTE — H&P (Signed)
Patient ID: Hunter Bridges, male DOB: January 27, 1951, 61 y.o. MRN: [id number]  Chief Complaint   Patient presents with   .  Other     biliary dyskinesia    HPI  Hunter Bridges is a 61 y.o. male.  HPI  This gentleman is referred by Dr. Loreta Ave. He has a several month to several year history of vague abdominal discomfort. He reports pain and nausea after fatty meals. He is now having constipation. Occasionally the pain through to the back. The pain is also periumbilical. It is mild to moderate in nature. He denies emesis. He is now constipated. He has had a thorough workup including upper endoscopy, lower endoscopy, capsule endoscopy, ultrasound, and HIDA scan.  Past Medical History   Diagnosis  Date   .  Insomnia    .  Allergy    .  Anxiety    .  Hyperlipidemia    .  GERD (gastroesophageal reflux disease)    .  Hiatal hernia    .  Abdominal pain     Past Surgical History   Procedure  Date   .  Eye surgery  2012   .  Colonoscopy  2013    Family History   Problem  Relation  Age of Onset   .  Cancer  Mother       colon    Social History  History   Substance Use Topics   .  Smoking status:  Former Games developer   .  Smokeless tobacco:  Not on file   .  Alcohol Use:  No    Allergies   Allergen  Reactions   .  Penicillins  Rash    Current Outpatient Prescriptions   Medication  Sig  Dispense  Refill   .  ALPRAZolam (XANAX) 0.5 MG tablet      .  azithromycin (ZITHROMAX) 250 MG tablet      .  DEXILANT 60 MG capsule      .  ELMIRON 100 MG capsule      .  fluticasone (FLONASE) 50 MCG/ACT nasal spray      .  JENTADUETO 2.5-850 MG TABS      .  LOVAZA 1 G capsule      .  meloxicam (MOBIC) 15 MG tablet      .  NIASPAN 500 MG CR tablet      .  pioglitazone-metformin (ACTOPLUS MET) 15-850 MG per tablet      .  simvastatin (ZOCOR) 80 MG tablet      .  VESICARE 10 MG tablet      .  VOLTAREN 1 % GEL      .  zolpidem (AMBIEN) 10 MG tablet      .  ZUPLENZ 8 MG FILM       Review of  Systems  Review of Systems  Constitutional: Positive for unexpected weight change. Negative for fever and chills.  HENT: Negative for hearing loss, congestion, sore throat, trouble swallowing and voice change.  Eyes: Negative for visual disturbance.  Respiratory: Negative for cough and wheezing.  Cardiovascular: Negative for chest pain, palpitations and leg swelling.  Gastrointestinal: Positive for nausea, abdominal pain, constipation and abdominal distention. Negative for vomiting, diarrhea, blood in stool, anal bleeding and rectal pain.  Genitourinary: Negative for hematuria and difficulty urinating.  Musculoskeletal: Negative for arthralgias.  Skin: Negative for rash and wound.  Neurological: Positive for weakness. Negative for seizures, syncope and headaches.  Hematological: Negative for adenopathy. Does not  bruise/bleed easily.  Psychiatric/Behavioral: Negative for confusion.   Blood pressure 112/70, pulse 96, temperature 96.6 F (35.9 C), temperature source Temporal, resp. rate 18, height 5' 9.5" (1.765 m), weight 151 lb 9.6 oz (68.765 kg).  Physical Exam  Physical Exam  Constitutional: He is oriented to person, place, and time. He appears well-developed and well-nourished. No distress.  HENT:  Head: Normocephalic and atraumatic.  Right Ear: External ear normal.  Left Ear: External ear normal.  Nose: Nose normal.  Mouth/Throat: Oropharynx is clear and moist. No oropharyngeal exudate.  Eyes: Conjunctivae normal are normal. Pupils are equal, round, and reactive to light. Right eye exhibits no discharge. Left eye exhibits no discharge. No scleral icterus.  Neck: Normal range of motion. Neck supple. No tracheal deviation present. No thyromegaly present.  Cardiovascular: Normal rate, regular rhythm, normal heart sounds and intact distal pulses.  No murmur heard.  Pulmonary/Chest: Effort normal and breath sounds normal. No respiratory distress. He has no wheezes. He has no rales.    Abdominal: Soft. Bowel sounds are normal. He exhibits no distension. There is no tenderness. There is no rebound.  Musculoskeletal: Normal range of motion. He exhibits no edema and no tenderness.  Lymphadenopathy:  He has no cervical adenopathy.  Neurological: He is alert and oriented to person, place, and time.  Skin: Skin is warm and dry. No rash noted. He is not diaphoretic. No erythema.  Psychiatric: His behavior is normal. Judgment normal.   Data Reviewed  Ultrasound is unremarkable. Upper and lower endoscopy reports were also normal. HIDA shows a 34% all bladder ejection fraction  Assessment   Biliary dyskinesia   Plan   I had a long discussion with the patient. He is eager to proceed with laparoscopic cholecystectomy. Again I am uncertain whether this is a total cause of his symptoms. Given his negative workup, I believe it is however reasonable and I can do a diagnostic laparoscopy at the same time. I discussed the risk of surgery which includes but is not limited to bleeding, infection, bowel duct injury, bile leak, injury to other structures, the need to convert to an open procedure, and the chance this may not resolve any of his symptoms. He understands and wishes to proceed. Surgery will be scheduled.   , A

## 2012-01-12 ENCOUNTER — Encounter (HOSPITAL_COMMUNITY)
Admission: RE | Disposition: A | Discharge status: Home / Self Care | Payer: Self-pay | Source: Ambulatory Visit | Attending: Surgery

## 2012-01-12 ENCOUNTER — Encounter (HOSPITAL_COMMUNITY): Payer: Self-pay | Admitting: Anesthesiology

## 2012-01-12 ENCOUNTER — Ambulatory Visit (HOSPITAL_COMMUNITY)
Admission: RE | Admit: 2012-01-12 | Discharge: 2012-01-12 | Disposition: A | Discharge status: Home / Self Care | Payer: BC Managed Care – PPO | Source: Ambulatory Visit | Attending: Surgery | Admitting: Surgery

## 2012-01-12 ENCOUNTER — Ambulatory Visit (HOSPITAL_COMMUNITY): Payer: BC Managed Care – PPO | Admitting: Anesthesiology

## 2012-01-12 ENCOUNTER — Encounter (HOSPITAL_COMMUNITY): Payer: Self-pay | Admitting: *Deleted

## 2012-01-12 DIAGNOSIS — E785 Hyperlipidemia, unspecified: Secondary | ICD-10-CM | POA: Insufficient documentation

## 2012-01-12 DIAGNOSIS — K219 Gastro-esophageal reflux disease without esophagitis: Secondary | ICD-10-CM | POA: Insufficient documentation

## 2012-01-12 DIAGNOSIS — Z79899 Other long term (current) drug therapy: Secondary | ICD-10-CM | POA: Insufficient documentation

## 2012-01-12 DIAGNOSIS — K811 Chronic cholecystitis: Secondary | ICD-10-CM

## 2012-01-12 HISTORY — PX: CHOLECYSTECTOMY: SHX55

## 2012-01-12 LAB — GLUCOSE, CAPILLARY: Glucose-Capillary: 151 mg/dL — ABNORMAL HIGH (ref 70–99)

## 2012-01-12 SURGERY — LAPAROSCOPIC CHOLECYSTECTOMY
Anesthesia: General | Site: Abdomen | Wound class: Clean Contaminated

## 2012-01-12 MED ORDER — HYDROCODONE-ACETAMINOPHEN 5-325 MG PO TABS: 1.0000 | ORAL_TABLET | ORAL | Status: DC | PRN

## 2012-01-12 MED ORDER — MORPHINE SULFATE 10 MG/ML IJ SOLN: 4.0000 mg | INTRAMUSCULAR | Status: DC | PRN

## 2012-01-12 MED ORDER — LIDOCAINE HCL 1 % IJ SOLN
INTRAMUSCULAR | Status: DC | PRN
  Administered 2012-01-12: 60 mg via INTRADERMAL

## 2012-01-12 MED ORDER — ONDANSETRON HCL 4 MG/2ML IJ SOLN
INTRAMUSCULAR | Status: DC | PRN
  Administered 2012-01-12: 4 mg via INTRAVENOUS

## 2012-01-12 MED ORDER — SUCCINYLCHOLINE CHLORIDE 20 MG/ML IJ SOLN
INTRAMUSCULAR | Status: DC | PRN
  Administered 2012-01-12: 100 mg via INTRAVENOUS

## 2012-01-12 MED ORDER — RINGERS IRRIGATION IR SOLN
Status: DC | PRN
  Administered 2012-01-12: 1

## 2012-01-12 MED ORDER — PROMETHAZINE HCL 25 MG/ML IJ SOLN: 6.2500 mg | INTRAMUSCULAR | Status: DC | PRN

## 2012-01-12 MED ORDER — SODIUM CHLORIDE 0.9 % IV SOLN: 250.0000 mL | INTRAVENOUS | Status: DC | PRN

## 2012-01-12 MED ORDER — CIPROFLOXACIN IN D5W 400 MG/200ML IV SOLN
INTRAVENOUS | Status: AC
  Filled 2012-01-12: qty 200

## 2012-01-12 MED ORDER — ONDANSETRON HCL 4 MG/2ML IJ SOLN: 4.0000 mg | Freq: Four times a day (QID) | INTRAMUSCULAR | Status: DC | PRN

## 2012-01-12 MED ORDER — EPHEDRINE SULFATE 50 MG/ML IJ SOLN
INTRAMUSCULAR | Status: DC | PRN
  Administered 2012-01-12 (×2): 10 mg via INTRAVENOUS

## 2012-01-12 MED ORDER — MIDAZOLAM HCL 5 MG/5ML IJ SOLN
INTRAMUSCULAR | Status: DC | PRN
  Administered 2012-01-12: 2 mg via INTRAVENOUS

## 2012-01-12 MED ORDER — ROCURONIUM BROMIDE 100 MG/10ML IV SOLN
INTRAVENOUS | Status: DC | PRN
  Administered 2012-01-12: 20 mg via INTRAVENOUS
  Administered 2012-01-12: 5 mg via INTRAVENOUS

## 2012-01-12 MED ORDER — LACTATED RINGERS IV SOLN: INTRAVENOUS | Status: DC

## 2012-01-12 MED ORDER — ACETAMINOPHEN 325 MG PO TABS: 650.0000 mg | ORAL_TABLET | ORAL | Status: DC | PRN

## 2012-01-12 MED ORDER — ACETAMINOPHEN 10 MG/ML IV SOLN
INTRAVENOUS | Status: AC
  Filled 2012-01-12: qty 100

## 2012-01-12 MED ORDER — CIPROFLOXACIN IN D5W 400 MG/200ML IV SOLN
400.0000 mg | INTRAVENOUS | Status: AC
  Administered 2012-01-12: 400 mg via INTRAVENOUS

## 2012-01-12 MED ORDER — ACETAMINOPHEN 10 MG/ML IV SOLN
INTRAVENOUS | Status: DC | PRN
  Administered 2012-01-12: 1000 mg via INTRAVENOUS

## 2012-01-12 MED ORDER — BUPIVACAINE HCL (PF) 0.5 % IJ SOLN
INTRAMUSCULAR | Status: AC
  Filled 2012-01-12: qty 30

## 2012-01-12 MED ORDER — KETOROLAC TROMETHAMINE 15 MG/ML IJ SOLN
INTRAMUSCULAR | Status: DC | PRN
  Administered 2012-01-12: 30 mg via INTRAVENOUS

## 2012-01-12 MED ORDER — PHENYLEPHRINE HCL 10 MG/ML IJ SOLN
INTRAMUSCULAR | Status: DC | PRN
  Administered 2012-01-12 (×2): 100 ug via INTRAVENOUS

## 2012-01-12 MED ORDER — SODIUM CHLORIDE 0.9 % IJ SOLN: 3.0000 mL | Freq: Two times a day (BID) | INTRAMUSCULAR | Status: DC

## 2012-01-12 MED ORDER — BUPIVACAINE HCL (PF) 0.5 % IJ SOLN
INTRAMUSCULAR | Status: DC | PRN
  Administered 2012-01-12: 20 mL

## 2012-01-12 MED ORDER — GLYCOPYRROLATE 0.2 MG/ML IJ SOLN
INTRAMUSCULAR | Status: DC | PRN
  Administered 2012-01-12: 0.6 mg via INTRAVENOUS

## 2012-01-12 MED ORDER — FENTANYL CITRATE 0.05 MG/ML IJ SOLN
INTRAMUSCULAR | Status: DC | PRN
  Administered 2012-01-12: 50 ug via INTRAVENOUS
  Administered 2012-01-12: 100 ug via INTRAVENOUS

## 2012-01-12 MED ORDER — PROPOFOL 10 MG/ML IV BOLUS
INTRAVENOUS | Status: DC | PRN
  Administered 2012-01-12: 200 mg via INTRAVENOUS

## 2012-01-12 MED ORDER — LACTATED RINGERS IV SOLN
INTRAVENOUS | Status: DC | PRN
  Administered 2012-01-12: 09:00:00 via INTRAVENOUS

## 2012-01-12 MED ORDER — SODIUM CHLORIDE 0.9 % IJ SOLN: 3.0000 mL | INTRAMUSCULAR | Status: DC | PRN

## 2012-01-12 MED ORDER — HYDROMORPHONE HCL PF 1 MG/ML IJ SOLN: 0.2500 mg | INTRAMUSCULAR | Status: DC | PRN

## 2012-01-12 MED ORDER — SODIUM CHLORIDE 0.9 % IR SOLN
Status: DC | PRN
  Administered 2012-01-12: 1000 mL

## 2012-01-12 MED ORDER — ACETAMINOPHEN 650 MG RE SUPP
650.0000 mg | RECTAL | Status: DC | PRN
  Filled 2012-01-12: qty 1

## 2012-01-12 MED ORDER — NEOSTIGMINE METHYLSULFATE 1 MG/ML IJ SOLN
INTRAMUSCULAR | Status: DC | PRN
  Administered 2012-01-12: 5 mg via INTRAVENOUS

## 2012-01-12 MED ORDER — OXYCODONE HCL 5 MG PO TABS
5.0000 mg | ORAL_TABLET | ORAL | Status: DC | PRN
  Administered 2012-01-12: 5 mg via ORAL
  Filled 2012-01-12: qty 1

## 2012-01-12 SURGICAL SUPPLY — 36 items
APL SKNCLS STERI-STRIP NONHPOA (GAUZE/BANDAGES/DRESSINGS) ×2
APPLIER CLIP 5 13 M/L LIGAMAX5 (MISCELLANEOUS) ×3
APR CLP MED LRG 5 ANG JAW (MISCELLANEOUS) ×2
BAG SPEC RTRVL LRG 6X4 10 (ENDOMECHANICALS)
BANDAGE ADHESIVE 1X3 (GAUZE/BANDAGES/DRESSINGS) ×12 IMPLANT
BENZOIN TINCTURE PRP APPL 2/3 (GAUZE/BANDAGES/DRESSINGS) ×3 IMPLANT
CANISTER SUCTION 2500CC (MISCELLANEOUS) ×3 IMPLANT
CHLORAPREP W/TINT 26ML (MISCELLANEOUS) ×3 IMPLANT
CLIP APPLIE 5 13 M/L LIGAMAX5 (MISCELLANEOUS) ×2 IMPLANT
CLOTH BEACON ORANGE TIMEOUT ST (SAFETY) ×3 IMPLANT
CLSR STERI-STRIP ANTIMIC 1/2X4 (GAUZE/BANDAGES/DRESSINGS) ×2 IMPLANT
COVER MAYO STAND STRL (DRAPES) IMPLANT
DECANTER SPIKE VIAL GLASS SM (MISCELLANEOUS) ×3 IMPLANT
DRAPE C-ARM 42X72 X-RAY (DRAPES) IMPLANT
DRAPE LAPAROSCOPIC ABDOMINAL (DRAPES) ×3 IMPLANT
ELECT REM PT RETURN 9FT ADLT (ELECTROSURGICAL) ×3
ELECTRODE REM PT RTRN 9FT ADLT (ELECTROSURGICAL) ×2 IMPLANT
GLOVE BIOGEL PI IND STRL 7.0 (GLOVE) ×2 IMPLANT
GLOVE BIOGEL PI INDICATOR 7.0 (GLOVE) ×1
GLOVE SURG SIGNA 7.5 PF LTX (GLOVE) ×6 IMPLANT
GOWN STRL NON-REIN LRG LVL3 (GOWN DISPOSABLE) ×3 IMPLANT
GOWN STRL REIN XL XLG (GOWN DISPOSABLE) ×6 IMPLANT
HEMOSTAT SURGICEL 4X8 (HEMOSTASIS) IMPLANT
KIT BASIN OR (CUSTOM PROCEDURE TRAY) ×3 IMPLANT
NS IRRIG 1000ML POUR BTL (IV SOLUTION) IMPLANT
POUCH SPECIMEN RETRIEVAL 10MM (ENDOMECHANICALS) IMPLANT
SET CHOLANGIOGRAPH MIX (MISCELLANEOUS) IMPLANT
SET IRRIG TUBING LAPAROSCOPIC (IRRIGATION / IRRIGATOR) ×3 IMPLANT
SOLUTION ANTI FOG 6CC (MISCELLANEOUS) ×3 IMPLANT
STRIP CLOSURE SKIN 1/2X4 (GAUZE/BANDAGES/DRESSINGS) ×3 IMPLANT
SUT MNCRL AB 4-0 PS2 18 (SUTURE) ×3 IMPLANT
TOWEL OR 17X26 10 PK STRL BLUE (TOWEL DISPOSABLE) ×3 IMPLANT
TRAY LAP CHOLE (CUSTOM PROCEDURE TRAY) ×3 IMPLANT
TROCAR BLADELESS OPT 5 75 (ENDOMECHANICALS) ×9 IMPLANT
TROCAR XCEL BLUNT TIP 100MML (ENDOMECHANICALS) ×3 IMPLANT
TUBING INSUFFLATION 10FT LAP (TUBING) ×3 IMPLANT

## 2012-01-12 NOTE — Interval H&P Note (Signed)
History and Physical Interval Note:  No change in H and P  01/12/2012 9:10 AM  Hunter Bridges  has presented today for surgery, with the diagnosis of biliary dyskinesia  The various methods of treatment have been discussed with the patient and family. After consideration of risks, benefits and other options for treatment, the patient has consented to  Procedure(s) (LRB) with comments: LAPAROSCOPIC CHOLECYSTECTOMY WITH INTRAOPERATIVE CHOLANGIOGRAM (N/A) as a surgical intervention .  The patient's history has been reviewed, patient examined, no change in status, stable for surgery.  I have reviewed the patient's chart and labs.  Questions were answered to the patient's satisfaction.     , A

## 2012-01-12 NOTE — Transfer of Care (Signed)
Immediate Anesthesia Transfer of Care Note  Patient: Hunter Bridges  Procedure(s) Performed: Procedure(s) (LRB) with comments: LAPAROSCOPIC CHOLECYSTECTOMY ()  Patient Location: PACU  Anesthesia Type:General  Level of Consciousness: awake and alert   Airway & Oxygen Therapy: Patient Spontanous Breathing and Patient connected to face mask oxygen  Post-op Assessment: Report given to PACU RN and Post -op Vital signs reviewed and stable  Post vital signs: Reviewed and stable  Complications: No apparent anesthesia complications

## 2012-01-12 NOTE — Anesthesia Preprocedure Evaluation (Signed)
Anesthesia Evaluation  Patient identified by MRN, date of birth, ID band Patient awake    Reviewed: Allergy & Precautions, H&P , NPO status , Patient's Chart, lab work & pertinent test results  History of Anesthesia Complications Negative for: history of anesthetic complications  Airway Mallampati: II TM Distance: >3 FB Neck ROM: Full    Dental  (+) Teeth Intact and Dental Advisory Given   Pulmonary neg pulmonary ROS,  breath sounds clear to auscultation        Cardiovascular negative cardio ROS  Rhythm:Regular Rate:Normal     Neuro/Psych Anxiety  Neuromuscular disease    GI/Hepatic Neg liver ROS, hiatal hernia, GERD-  Medicated,  Endo/Other  diabetes, Well Controlled, Type 2, Oral Hypoglycemic Agents  Renal/GU negative Renal ROS  negative genitourinary   Musculoskeletal negative musculoskeletal ROS (+)   Abdominal   Peds  Hematology negative hematology ROS (+)   Anesthesia Other Findings   Reproductive/Obstetrics negative OB ROS                           Anesthesia Physical Anesthesia Plan  ASA: II  Anesthesia Plan: General   Post-op Pain Management:    Induction: Intravenous  Airway Management Planned: Oral ETT  Additional Equipment:   Intra-op Plan:   Post-operative Plan: Extubation in OR  Informed Consent: I have reviewed the patients History and Physical, chart, labs and discussed the procedure including the risks, benefits and alternatives for the proposed anesthesia with the patient or authorized representative who has indicated his/her understanding and acceptance.   Dental advisory given  Plan Discussed with: CRNA  Anesthesia Plan Comments:         Anesthesia Quick Evaluation

## 2012-01-12 NOTE — Anesthesia Postprocedure Evaluation (Signed)
Anesthesia Post Note  Patient: Hunter Bridges  Procedure(s) Performed: Procedure(s) (LRB): LAPAROSCOPIC CHOLECYSTECTOMY ()  Anesthesia type: General  Patient location: PACU  Post pain: Pain level controlled  Post assessment: Post-op Vital signs reviewed  Last Vitals:  Filed Vitals:   01/12/12 1100  BP: 105/63  Pulse: 73  Temp: 36.4 C  Resp: 15    Post vital signs: Reviewed  Level of consciousness: sedated  Complications: No apparent anesthesia complications

## 2012-01-12 NOTE — Op Note (Signed)
Laparoscopic Cholecystectomy Procedure Note  Indications: This patient presents with symptomatic gallbladder disease and will undergo laparoscopic cholecystectomy.  Pre-operative Diagnosis: biliary dyskinesia  Post-operative Diagnosis: Same  Surgeon: Abigail Miyamoto A   Assistants: 0  Anesthesia: General endotracheal anesthesia  ASA Class: 2  Procedure Details  The patient was seen again in the Holding Room. The risks, benefits, complications, treatment options, and expected outcomes were discussed with the patient. The possibilities of reaction to medication, pulmonary aspiration, perforation of viscus, bleeding, recurrent infection, finding a normal gallbladder, the need for additional procedures, failure to diagnose a condition, the possible need to convert to an open procedure, and creating a complication requiring transfusion or operation were discussed with the patient. The likelihood of improving the patient's symptoms with return to their baseline status is good.  The patient and/or family concurred with the proposed plan, giving informed consent. The site of surgery properly noted. The patient was taken to Operating Room, identified as Hunter Bridges and the procedure verified as Laparoscopic Cholecystectomy with Intraoperative Cholangiogram. A Time Out was held and the above information confirmed.  Prior to the induction of general anesthesia, antibiotic prophylaxis was administered. General endotracheal anesthesia was then administered and tolerated well. After the induction, the abdomen was prepped with Chloraprep and draped in sterile fashion. The patient was positioned in the supine position.  Local anesthetic agent was injected into the skin near the umbilicus and an incision made. We dissected down to the abdominal fascia with blunt dissection.  The fascia was incised vertically and we entered the peritoneal cavity bluntly.  A pursestring suture of 0-Vicryl was placed around  the fascial opening.  The Hasson cannula was inserted and secured with the stay suture.  Pneumoperitoneum was then created with CO2 and tolerated well without any adverse changes in the patient's vital signs. An 11-mm port was placed in the subxiphoid position.  Two 5-mm ports were placed in the right upper quadrant. All skin incisions were infiltrated with a local anesthetic agent before making the incision and placing the trocars.   We positioned the patient in reverse Trendelenburg, tilted slightly to the patient's left.  The gallbladder was identified, the fundus grasped and retracted cephalad. Adhesions were lysed bluntly and with the electrocautery where indicated, taking care not to injure any adjacent organs or viscus. The infundibulum was grasped and retracted laterally, exposing the peritoneum overlying the triangle of Calot. This was then divided and exposed in a blunt fashion. The cystic duct was clearly identified and bluntly dissected circumferentially. A critical view of the cystic duct and cystic artery was obtained.  The cystic duct was then ligated with clips and divided. The cystic artery was, dissected free, ligated with clips and divided as well.   The gallbladder was dissected from the liver bed in retrograde fashion with the electrocautery. The gallbladder was removed and placed in an Endocatch sac. The liver bed was irrigated and inspected. Hemostasis was achieved with the electrocautery. Copious irrigation was utilized and was repeatedly aspirated until clear.  The gallbladder and Endocatch sac were then removed through the umbilical port site.  The pursestring suture was used to close the umbilical fascia.    We again inspected the right upper quadrant for hemostasis.  Pneumoperitoneum was released as we removed the trocars.  4-0 Monocryl was used to close the skin.   Benzoin, steri-strips, and clean dressings were applied. The patient was then extubated and brought to the recovery  room in stable condition. Instrument, sponge, and needle  counts were correct at closure and at the conclusion of the case.   Findings: Cholecystitis without Cholelithiasis  Estimated Blood Loss: Minimal         Drains: 0         Specimens: Gallbladder           Complications: None; patient tolerated the procedure well.         Disposition: PACU - hemodynamically stable.         Condition: stable

## 2012-01-13 ENCOUNTER — Encounter (HOSPITAL_COMMUNITY): Payer: Self-pay | Admitting: Surgery

## 2012-01-14 ENCOUNTER — Telehealth (INDEPENDENT_AMBULATORY_CARE_PROVIDER_SITE_OTHER): Payer: Self-pay

## 2012-01-14 NOTE — Telephone Encounter (Signed)
Pt called reporting that his incisions were "oozing" small amounts of dark blood.  I informed him this was normal, since his surgery was just yesterday.  He also wanted to know when he could go back to work and was told one week, but two would be best.

## 2012-01-21 ENCOUNTER — Encounter (INDEPENDENT_AMBULATORY_CARE_PROVIDER_SITE_OTHER): Payer: Self-pay | Admitting: General Surgery

## 2012-01-26 ENCOUNTER — Encounter (INDEPENDENT_AMBULATORY_CARE_PROVIDER_SITE_OTHER): Payer: BC Managed Care – PPO | Admitting: Surgery

## 2012-02-02 ENCOUNTER — Ambulatory Visit (INDEPENDENT_AMBULATORY_CARE_PROVIDER_SITE_OTHER): Payer: BC Managed Care – PPO | Admitting: Surgery

## 2012-02-02 ENCOUNTER — Encounter (INDEPENDENT_AMBULATORY_CARE_PROVIDER_SITE_OTHER): Payer: Self-pay | Admitting: Surgery

## 2012-02-02 VITALS — BP 110/78 | HR 108 | Temp 97.8°F | Resp 18 | Ht 69.5 in | Wt 151.5 lb

## 2012-02-02 DIAGNOSIS — Z09 Encounter for follow-up examination after completed treatment for conditions other than malignant neoplasm: Secondary | ICD-10-CM

## 2012-02-02 NOTE — Progress Notes (Signed)
Subjective:     Patient ID: Hunter Bridges, male   DOB: 1950/07/11, 61 y.o.   MRN: [id number]  HPI He is here for his first postop visit status post laparoscopic cholecystectomy. He is doing well and has no complaints.  Review of Systems     Objective:   Physical Exam  On exam, his incisions are healing well.The final pathology showed chronic cholecystitis    Assessment:     Patient stable postop    Plan:     He may resume normal activity. I will see him back as needed

## 2012-03-03 HISTORY — PX: CERVICAL DISC SURGERY: SHX588

## 2012-03-18 ENCOUNTER — Encounter (INDEPENDENT_AMBULATORY_CARE_PROVIDER_SITE_OTHER): Payer: Self-pay

## 2013-01-31 ENCOUNTER — Ambulatory Visit: Payer: BC Managed Care – PPO | Attending: Neurological Surgery | Admitting: Physical Therapy

## 2013-01-31 DIAGNOSIS — M25569 Pain in unspecified knee: Secondary | ICD-10-CM | POA: Insufficient documentation

## 2013-01-31 DIAGNOSIS — M545 Low back pain, unspecified: Secondary | ICD-10-CM | POA: Insufficient documentation

## 2013-01-31 DIAGNOSIS — IMO0001 Reserved for inherently not codable concepts without codable children: Secondary | ICD-10-CM | POA: Insufficient documentation

## 2013-02-03 ENCOUNTER — Ambulatory Visit: Payer: BC Managed Care – PPO | Admitting: Physical Therapy

## 2013-02-08 ENCOUNTER — Ambulatory Visit: Payer: BC Managed Care – PPO | Admitting: Physical Therapy

## 2013-02-10 ENCOUNTER — Ambulatory Visit: Payer: BC Managed Care – PPO | Admitting: Physical Therapy

## 2013-02-15 ENCOUNTER — Ambulatory Visit: Payer: BC Managed Care – PPO | Admitting: Physical Therapy

## 2013-02-17 ENCOUNTER — Ambulatory Visit: Payer: BC Managed Care – PPO | Admitting: Physical Therapy

## 2013-02-22 ENCOUNTER — Ambulatory Visit: Payer: BC Managed Care – PPO | Admitting: Physical Therapy

## 2013-03-01 ENCOUNTER — Ambulatory Visit: Payer: BC Managed Care – PPO | Admitting: Physical Therapy

## 2013-03-14 ENCOUNTER — Ambulatory Visit: Payer: BC Managed Care – PPO | Attending: Neurological Surgery | Admitting: Physical Therapy

## 2013-03-14 DIAGNOSIS — IMO0001 Reserved for inherently not codable concepts without codable children: Secondary | ICD-10-CM | POA: Insufficient documentation

## 2013-03-14 DIAGNOSIS — M545 Low back pain, unspecified: Secondary | ICD-10-CM | POA: Insufficient documentation

## 2013-03-14 DIAGNOSIS — M25569 Pain in unspecified knee: Secondary | ICD-10-CM | POA: Insufficient documentation

## 2013-03-23 ENCOUNTER — Ambulatory Visit: Payer: BC Managed Care – PPO | Admitting: Physical Therapy

## 2013-04-19 IMAGING — NM NM HEPATO W/GB/PHARM/[PERSON_NAME]
1 series · 1 of 1 positions shown · non-contrast
Comparison: Ultrasound 12/10/2011

CLINICAL DATA: Epigastric pain and nausea

NUCLEAR MEDICINE HEPATOBILIARY IMAGING WITH GALLBLADDER EF
TECHNIQUE: Sequential images of the abdomen were obtained [DATE]
minutes following intravenous administration of
radiopharmaceutical.  After slow intravenous infusion of 1.4 ucg
Cholecystokinin, gallbladder ejection fraction was determined.
Radiopharmaceutical:  5.0 mCi Ic-33m Choletec

[hepatobiliary · 1 of 1 slices shown]
[im 1/1]
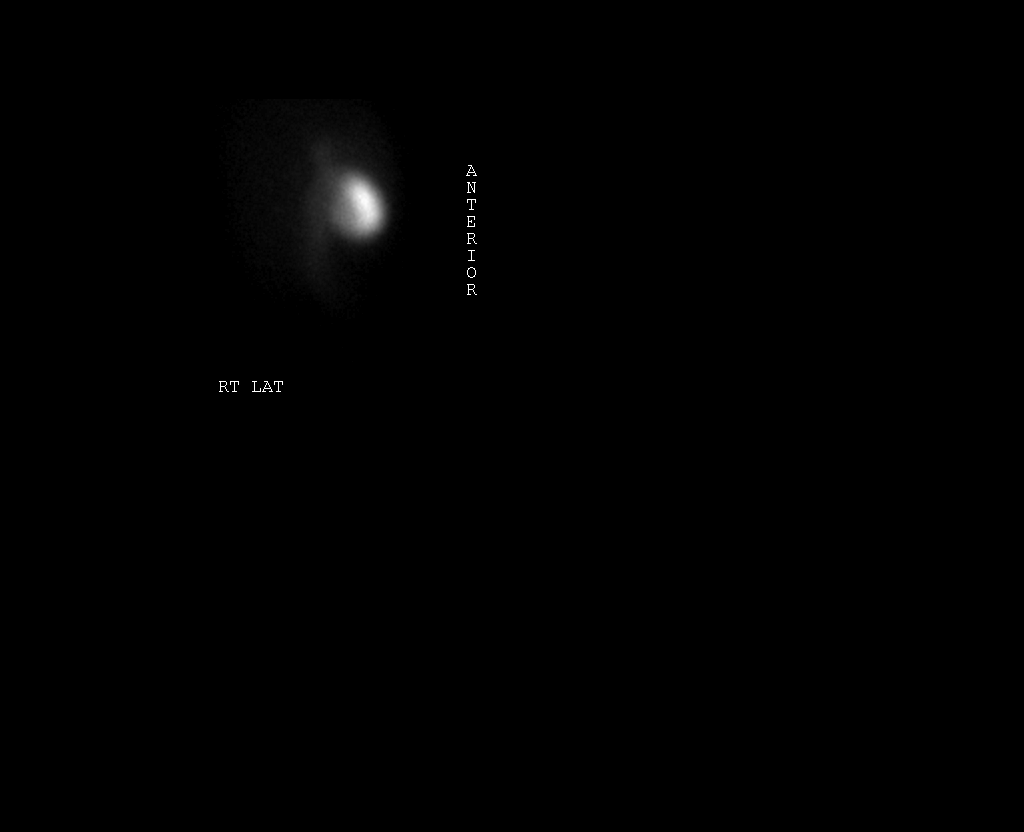

[1 of 1 positions shown; findings below may reference images not displayed]

FINDINGS: There is prompt extraction of radiotracer from the blood
pool and homogeneous uptake within the liver.  The gallbladder is
evident by 20 minutes.  Counts are present within the bowel by 35
minutes.  Upon administration of a cholecystokinin, the gallbladder
contracts appropriately with a calculated ejection fraction = 34%
at 30 min (Normal greater than 30 % ejection).
IMPRESSION: 1. Normal gallbladder ejection fraction =  34%.
2. Patent cystic duct and common bile duct.

## 2013-05-09 ENCOUNTER — Ambulatory Visit: Payer: BC Managed Care – PPO | Attending: Neurological Surgery | Admitting: Physical Therapy

## 2013-05-09 DIAGNOSIS — M545 Low back pain, unspecified: Secondary | ICD-10-CM | POA: Insufficient documentation

## 2013-05-09 DIAGNOSIS — IMO0001 Reserved for inherently not codable concepts without codable children: Secondary | ICD-10-CM | POA: Insufficient documentation

## 2013-05-09 DIAGNOSIS — M25569 Pain in unspecified knee: Secondary | ICD-10-CM | POA: Insufficient documentation

## 2013-05-18 ENCOUNTER — Ambulatory Visit: Payer: BC Managed Care – PPO | Admitting: Physical Therapy

## 2013-05-25 ENCOUNTER — Ambulatory Visit: Payer: BC Managed Care – PPO | Admitting: Physical Therapy

## 2013-05-31 ENCOUNTER — Ambulatory Visit: Payer: BC Managed Care – PPO | Admitting: Physical Therapy

## 2013-06-08 ENCOUNTER — Ambulatory Visit: Payer: BC Managed Care – PPO | Attending: Neurological Surgery | Admitting: Physical Therapy

## 2013-06-08 DIAGNOSIS — IMO0001 Reserved for inherently not codable concepts without codable children: Secondary | ICD-10-CM | POA: Insufficient documentation

## 2013-06-08 DIAGNOSIS — M545 Low back pain, unspecified: Secondary | ICD-10-CM | POA: Insufficient documentation

## 2013-06-08 DIAGNOSIS — M25569 Pain in unspecified knee: Secondary | ICD-10-CM | POA: Insufficient documentation

## 2013-06-15 ENCOUNTER — Ambulatory Visit: Payer: BC Managed Care – PPO | Admitting: Physical Therapy

## 2013-06-22 ENCOUNTER — Ambulatory Visit: Payer: BC Managed Care – PPO | Admitting: Physical Therapy

## 2013-10-11 ENCOUNTER — Other Ambulatory Visit: Payer: Self-pay | Admitting: Neurological Surgery

## 2013-11-11 ENCOUNTER — Encounter (HOSPITAL_COMMUNITY): Payer: Self-pay

## 2013-11-11 ENCOUNTER — Encounter (HOSPITAL_COMMUNITY)
Admission: RE | Admit: 2013-11-11 | Discharge: 2013-11-11 | Disposition: A | Discharge status: Home / Self Care | Payer: BC Managed Care – PPO | Source: Ambulatory Visit | Attending: Neurological Surgery | Admitting: Neurological Surgery

## 2013-11-11 ENCOUNTER — Ambulatory Visit (HOSPITAL_COMMUNITY)
Admission: RE | Admit: 2013-11-11 | Discharge: 2013-11-11 | Disposition: A | Discharge status: Home / Self Care | Payer: BC Managed Care – PPO | Source: Ambulatory Visit | Attending: Neurological Surgery | Admitting: Neurological Surgery

## 2013-11-11 DIAGNOSIS — J189 Pneumonia, unspecified organism: Secondary | ICD-10-CM | POA: Insufficient documentation

## 2013-11-11 DIAGNOSIS — K449 Diaphragmatic hernia without obstruction or gangrene: Secondary | ICD-10-CM | POA: Insufficient documentation

## 2013-11-11 DIAGNOSIS — E119 Type 2 diabetes mellitus without complications: Secondary | ICD-10-CM | POA: Insufficient documentation

## 2013-11-11 DIAGNOSIS — Z01818 Encounter for other preprocedural examination: Secondary | ICD-10-CM | POA: Diagnosis present

## 2013-11-11 DIAGNOSIS — K219 Gastro-esophageal reflux disease without esophagitis: Secondary | ICD-10-CM | POA: Diagnosis not present

## 2013-11-11 HISTORY — DX: Pneumonia, unspecified organism: J18.9

## 2013-11-11 LAB — CBC WITH DIFFERENTIAL/PLATELET
BASOS PCT: 1 % (ref 0–1)
Basophils Absolute: 0 10*3/uL (ref 0.0–0.1)
EOS ABS: 0.1 10*3/uL (ref 0.0–0.7)
EOS PCT: 3 % (ref 0–5)
HCT: 38.6 % — ABNORMAL LOW (ref 39.0–52.0)
Hemoglobin: 12.7 g/dL — ABNORMAL LOW (ref 13.0–17.0)
Lymphocytes Relative: 22 % (ref 12–46)
Lymphs Abs: 0.9 10*3/uL (ref 0.7–4.0)
MCH: 28.3 pg (ref 26.0–34.0)
MCHC: 32.9 g/dL (ref 30.0–36.0)
MCV: 86 fL (ref 78.0–100.0)
MONO ABS: 0.4 10*3/uL (ref 0.1–1.0)
MONOS PCT: 11 % (ref 3–12)
Neutro Abs: 2.5 10*3/uL (ref 1.7–7.7)
Neutrophils Relative %: 63 % (ref 43–77)
Platelets: 180 10*3/uL (ref 150–400)
RBC: 4.49 MIL/uL (ref 4.22–5.81)
RDW: 14.2 % (ref 11.5–15.5)
WBC: 4 10*3/uL (ref 4.0–10.5)

## 2013-11-11 LAB — BASIC METABOLIC PANEL
Anion gap: 10 (ref 5–15)
BUN: 10 mg/dL (ref 6–23)
CO2: 25 mEq/L (ref 19–32)
Calcium: 9 mg/dL (ref 8.4–10.5)
Chloride: 99 mEq/L (ref 96–112)
Creatinine, Ser: 0.74 mg/dL (ref 0.50–1.35)
GFR calc Af Amer: >90 mL/min (ref >90)
GFR calc non Af Amer: >90 mL/min (ref >90)
Glucose, Bld: 140 mg/dL — ABNORMAL HIGH (ref 70–99)
Potassium: 4.6 mEq/L (ref 3.7–5.3)
Sodium: 134 mEq/L — ABNORMAL LOW (ref 137–147)

## 2013-11-11 LAB — PROTIME-INR
INR: 1.06 (ref 0.00–1.49)
PROTHROMBIN TIME: 13.8 s (ref 11.6–15.2)

## 2013-11-11 LAB — SURGICAL PCR SCREEN
MRSA, PCR: NEGATIVE
Staphylococcus aureus: NEGATIVE

## 2013-11-11 LAB — TYPE AND SCREEN
ABO/RH(D): A POS
Antibody Screen: NEGATIVE

## 2013-11-11 LAB — ABO/RH: ABO/RH(D): A POS

## 2013-11-11 NOTE — Pre-Procedure Instructions (Addendum)
Hunter Bridges  11/11/2013   Your procedure is scheduled on:  Wednesday, Sept. 23, 2015  Report to [facility] Admitting at 6:30 AM.  Call this number if you have problems the morning of surgery: [phone]   Remember:   Do not eat food or drink liquids after midnight Wednesday, Sept 22, 2015   Take these medicines the morning of surgery with A SIP OF WATER:   Dexilant, ,fluticasone (FLONASE)  nasal spray, if needed:acetaminophen (TYLENOL) for pain,  HYDROcodone-acetaminophen ([place]) for pain, ALPRAZolam Duanne Moron) for anxiety ARTIFICIAL TEARS), for dry eyes, ZUPLENZ 8 MG FILM for nausea    Inhaler if needed    Take all meds as ordered until day of surgery except as instructed below or per dr  Follow doctors instructions regarding ELMIRON  Stop taking Aspirin,  vitamins, and herbal medications (omega-3 acid ethyl esters (LOVAZA)  meloxicam, voltaren,niaspan,sudafed 5 days before surgery(11/18/13)  .Do not take any NSAIDs ie: Ibuprofen, Advil, Naproxen or any medication containing Aspirin         Do not wear jewelry, make-up or nail polish.  Do not wear lotions, powders, or perfumes. You may not wear deodorant.  Do not shave 48 hours prior to surgery. Men may shave face and neck.  Do not bring valuables to the hospital.  [facility] is not responsible  for any belongings or valuables.               Contacts, dentures or bridgework may not be worn into surgery.  Leave suitcase in the car. After surgery it may be brought to your room.  For patients admitted to the hospital, discharge time is determined by your treatment team.               Patients discharged the day of surgery will not be allowed to drive home.  Name and phone number of your driver:   Special Instructions:  Special Instructions:Special Instructions: [facility] - Preparing for Surgery  Before surgery, you can play an important role.  Because skin is not sterile, your skin needs to be as free of germs as  possible.  You can reduce the number of germs on you skin by washing with CHG (chlorahexidine gluconate) soap before surgery.  CHG is an antiseptic cleaner which kills germs and bonds with the skin to continue killing germs even after washing.  Please DO NOT use if you have an allergy to CHG or antibacterial soaps.  If your skin becomes reddened/irritated stop using the CHG and inform your nurse when you arrive at Short Stay.  Do not shave (including legs and underarms) for at least 48 hours prior to the first CHG shower.  You may shave your face.  Please follow these instructions carefully:   1.  Shower with CHG Soap the night before surgery and the morning of Surgery.  2.  If you choose to wash your hair, wash your hair first as usual with your normal shampoo.  3.  After you shampoo, rinse your hair and body thoroughly to remove the Shampoo.  4.  Use CHG as you would any other liquid soap.  You can apply chg directly  to the skin and wash gently with scrungie or a clean washcloth.  5.  Apply the CHG Soap to your body ONLY FROM THE NECK DOWN.  Do not use on open wounds or open sores.  Avoid contact with your eyes, ears, mouth and genitals (private parts).  Wash genitals (private parts)  with your normal soap.  6.  Wash thoroughly, paying special attention to the area where your surgery will be performed.  7.  Thoroughly rinse your body with warm water from the neck down.  8.  DO NOT shower/wash with your normal soap after using and rinsing off the CHG Soap.  9.  Pat yourself dry with a clean towel.            10.  Wear clean pajamas.            11.  Place clean sheets on your bed the night of your first shower and do not sleep with pets.  Day of Surgery  Do not apply any lotions/deodorants the morning of surgery.  Please wear clean clothes to the hospital/surgery center.   Please read over the following fact sheets that you were given: Pain Booklet, Coughing and Deep Breathing, Blood  Transfusion Information, MRSA Information and Surgical Site Infection Prevention

## 2013-11-22 MED ORDER — VANCOMYCIN HCL IN DEXTROSE 1-5 GM/200ML-% IV SOLN
1000.0000 mg | INTRAVENOUS | Status: AC
  Administered 2013-11-23: 1000 mg via INTRAVENOUS
  Filled 2013-11-22: qty 200

## 2013-11-22 MED ORDER — DEXAMETHASONE SODIUM PHOSPHATE 10 MG/ML IJ SOLN
10.0000 mg | INTRAMUSCULAR | Status: AC
  Administered 2013-11-23: 10 mg via INTRAVENOUS

## 2013-11-22 NOTE — Progress Notes (Signed)
Spoke with patient who stated Dr. Ronnald Ramp office asked him to arrive at 0830 am. Instructed patient to arrive at 0830 am 11/23/13.

## 2013-11-23 ENCOUNTER — Encounter (HOSPITAL_COMMUNITY)
Admission: RE | Disposition: A | Discharge status: Home / Self Care | Payer: Self-pay | Source: Ambulatory Visit | Attending: Neurological Surgery

## 2013-11-23 ENCOUNTER — Inpatient Hospital Stay (HOSPITAL_COMMUNITY): Payer: BC Managed Care – PPO

## 2013-11-23 ENCOUNTER — Inpatient Hospital Stay (HOSPITAL_COMMUNITY): Payer: BC Managed Care – PPO | Admitting: Vascular Surgery

## 2013-11-23 ENCOUNTER — Inpatient Hospital Stay (HOSPITAL_COMMUNITY): Payer: BC Managed Care – PPO | Admitting: Certified Registered"

## 2013-11-23 ENCOUNTER — Inpatient Hospital Stay (HOSPITAL_COMMUNITY)
Admission: RE | Admit: 2013-11-23 | Discharge: 2013-11-24 | DRG: 460 | Disposition: A | Discharge status: Home / Self Care | Payer: BC Managed Care – PPO | Source: Ambulatory Visit | Attending: Neurological Surgery | Admitting: Neurological Surgery

## 2013-11-23 ENCOUNTER — Encounter (HOSPITAL_COMMUNITY): Payer: Self-pay | Admitting: *Deleted

## 2013-11-23 DIAGNOSIS — F411 Generalized anxiety disorder: Secondary | ICD-10-CM | POA: Diagnosis present

## 2013-11-23 DIAGNOSIS — Z981 Arthrodesis status: Secondary | ICD-10-CM

## 2013-11-23 DIAGNOSIS — Z79899 Other long term (current) drug therapy: Secondary | ICD-10-CM | POA: Diagnosis not present

## 2013-11-23 DIAGNOSIS — Z87891 Personal history of nicotine dependence: Secondary | ICD-10-CM | POA: Diagnosis not present

## 2013-11-23 DIAGNOSIS — E785 Hyperlipidemia, unspecified: Secondary | ICD-10-CM | POA: Diagnosis present

## 2013-11-23 DIAGNOSIS — M4310 Spondylolisthesis, site unspecified: Principal | ICD-10-CM | POA: Diagnosis present

## 2013-11-23 DIAGNOSIS — M545 Low back pain, unspecified: Secondary | ICD-10-CM | POA: Diagnosis present

## 2013-11-23 DIAGNOSIS — E119 Type 2 diabetes mellitus without complications: Secondary | ICD-10-CM | POA: Diagnosis present

## 2013-11-23 HISTORY — PX: MAXIMUM ACCESS (MAS)POSTERIOR LUMBAR INTERBODY FUSION (PLIF) 1 LEVEL: SHX6368

## 2013-11-23 LAB — GLUCOSE, CAPILLARY
GLUCOSE-CAPILLARY: 115 mg/dL — AB (ref 70–99)
Glucose-Capillary: 169 mg/dL — ABNORMAL HIGH (ref 70–99)
Glucose-Capillary: 145 mg/dL — ABNORMAL HIGH (ref 70–99)

## 2013-11-23 SURGERY — FOR MAXIMUM ACCESS (MAS) POSTERIOR LUMBAR INTERBODY FUSION (PLIF) 1 LEVEL
Anesthesia: General | Site: Spine Lumbar

## 2013-11-23 MED ORDER — HYDROMORPHONE HCL 1 MG/ML IJ SOLN
INTRAMUSCULAR | Status: AC
  Administered 2013-11-23: 0.5 mg via INTRAVENOUS
  Filled 2013-11-23: qty 1

## 2013-11-23 MED ORDER — ALBUTEROL SULFATE HFA 108 (90 BASE) MCG/ACT IN AERS: 1.0000 | INHALATION_SPRAY | Freq: Four times a day (QID) | RESPIRATORY_TRACT | Status: DC | PRN

## 2013-11-23 MED ORDER — HYDROMORPHONE HCL 1 MG/ML IJ SOLN
INTRAMUSCULAR | Status: AC
  Filled 2013-11-23: qty 1

## 2013-11-23 MED ORDER — POTASSIUM CHLORIDE IN NACL 20-0.9 MEQ/L-% IV SOLN
INTRAVENOUS | Status: DC
  Filled 2013-11-23 (×3): qty 1000

## 2013-11-23 MED ORDER — METHOCARBAMOL 500 MG PO TABS
500.0000 mg | ORAL_TABLET | Freq: Three times a day (TID) | ORAL | Status: DC | PRN
  Administered 2013-11-23: 500 mg via ORAL
  Filled 2013-11-23 (×2): qty 1

## 2013-11-23 MED ORDER — BISACODYL 10 MG RE SUPP: 10.0000 mg | Freq: Every day | RECTAL | Status: DC | PRN

## 2013-11-23 MED ORDER — ACETAMINOPHEN 325 MG PO TABS
650.0000 mg | ORAL_TABLET | ORAL | Status: DC | PRN
Start: 2013-11-23 — End: 2013-11-24

## 2013-11-23 MED ORDER — MIDAZOLAM HCL 5 MG/5ML IJ SOLN
INTRAMUSCULAR | Status: DC | PRN
  Administered 2013-11-23: 2 mg via INTRAVENOUS

## 2013-11-23 MED ORDER — SODIUM CHLORIDE 0.9 % IV SOLN: 250.0000 mL | INTRAVENOUS | Status: DC

## 2013-11-23 MED ORDER — ONDANSETRON HCL 4 MG/2ML IJ SOLN: 4.0000 mg | INTRAMUSCULAR | Status: DC | PRN

## 2013-11-23 MED ORDER — METHYLCELLULOSE 1 % OP SOLN: 1.0000 [drp] | OPHTHALMIC | Status: DC | PRN

## 2013-11-23 MED ORDER — DARIFENACIN HYDROBROMIDE ER 7.5 MG PO TB24
7.5000 mg | ORAL_TABLET | Freq: Every day | ORAL | Status: DC
  Administered 2013-11-24: 7.5 mg via ORAL
  Filled 2013-11-23: qty 1

## 2013-11-23 MED ORDER — THROMBIN 5000 UNITS EX SOLR
CUTANEOUS | Status: DC | PRN
  Administered 2013-11-23: 13:00:00 via TOPICAL

## 2013-11-23 MED ORDER — ARTIFICIAL TEARS OP OINT
TOPICAL_OINTMENT | OPHTHALMIC | Status: DC | PRN
  Administered 2013-11-23: 1 via OPHTHALMIC

## 2013-11-23 MED ORDER — METHOCARBAMOL 500 MG PO TABS
500.0000 mg | ORAL_TABLET | Freq: Four times a day (QID) | ORAL | Status: DC | PRN
  Administered 2013-11-23 – 2013-11-24 (×3): 500 mg via ORAL
  Filled 2013-11-23 (×2): qty 1

## 2013-11-23 MED ORDER — FENTANYL CITRATE 0.05 MG/ML IJ SOLN
INTRAMUSCULAR | Status: DC | PRN
  Administered 2013-11-23 (×4): 50 ug via INTRAVENOUS

## 2013-11-23 MED ORDER — SENNA 8.6 MG PO TABS
1.0000 | ORAL_TABLET | Freq: Two times a day (BID) | ORAL | Status: DC
  Administered 2013-11-23 – 2013-11-24 (×2): 8.6 mg via ORAL
  Filled 2013-11-23 (×2): qty 1

## 2013-11-23 MED ORDER — SODIUM CHLORIDE 0.9 % IV SOLN
10.0000 mg | INTRAVENOUS | Status: DC | PRN
  Administered 2013-11-23: 40 ug/min via INTRAVENOUS

## 2013-11-23 MED ORDER — VANCOMYCIN HCL IN DEXTROSE 1-5 GM/200ML-% IV SOLN
1000.0000 mg | Freq: Once | INTRAVENOUS | Status: AC
Start: 2013-11-24 — End: 2013-11-24
  Administered 2013-11-23: 1000 mg via INTRAVENOUS
  Filled 2013-11-23: qty 200

## 2013-11-23 MED ORDER — SUCCINYLCHOLINE CHLORIDE 20 MG/ML IJ SOLN
INTRAMUSCULAR | Status: DC | PRN
  Administered 2013-11-23: 80 mg via INTRAVENOUS

## 2013-11-23 MED ORDER — CELECOXIB 200 MG PO CAPS
200.0000 mg | ORAL_CAPSULE | Freq: Two times a day (BID) | ORAL | Status: DC
Start: 2013-11-23 — End: 2013-11-24
  Administered 2013-11-23 – 2013-11-24 (×2): 200 mg via ORAL
  Filled 2013-11-23 (×3): qty 1

## 2013-11-23 MED ORDER — ACETAMINOPHEN 650 MG RE SUPP: 650.0000 mg | RECTAL | Status: DC | PRN

## 2013-11-23 MED ORDER — ONDANSETRON HCL 4 MG/2ML IJ SOLN: 4.0000 mg | Freq: Once | INTRAMUSCULAR | Status: DC | PRN

## 2013-11-23 MED ORDER — HYDROMORPHONE HCL 1 MG/ML IJ SOLN
0.2500 mg | INTRAMUSCULAR | Status: DC | PRN
  Administered 2013-11-23 (×4): 0.5 mg via INTRAVENOUS

## 2013-11-23 MED ORDER — LACTATED RINGERS IV SOLN
INTRAVENOUS | Status: DC
  Administered 2013-11-23: 10:00:00 via INTRAVENOUS

## 2013-11-23 MED ORDER — PHENYLEPHRINE HCL 10 MG/ML IJ SOLN
INTRAMUSCULAR | Status: DC | PRN
  Administered 2013-11-23 (×3): 80 ug via INTRAVENOUS

## 2013-11-23 MED ORDER — ALBUTEROL SULFATE (2.5 MG/3ML) 0.083% IN NEBU: 2.5000 mg | INHALATION_SOLUTION | Freq: Four times a day (QID) | RESPIRATORY_TRACT | Status: DC | PRN

## 2013-11-23 MED ORDER — PHENOL 1.4 % MT LIQD: 1.0000 | OROMUCOSAL | Status: DC | PRN

## 2013-11-23 MED ORDER — OXYCODONE-ACETAMINOPHEN 5-325 MG PO TABS
1.0000 | ORAL_TABLET | ORAL | Status: DC | PRN
  Administered 2013-11-23 – 2013-11-24 (×4): 2 via ORAL
  Filled 2013-11-23 (×3): qty 2

## 2013-11-23 MED ORDER — METHOCARBAMOL 500 MG PO TABS
ORAL_TABLET | ORAL | Status: AC
  Filled 2013-11-23: qty 1

## 2013-11-23 MED ORDER — MORPHINE SULFATE 2 MG/ML IJ SOLN: 1.0000 mg | INTRAMUSCULAR | Status: DC | PRN

## 2013-11-23 MED ORDER — OXYCODONE-ACETAMINOPHEN 5-325 MG PO TABS
ORAL_TABLET | ORAL | Status: AC
  Filled 2013-11-23: qty 2

## 2013-11-23 MED ORDER — LACTATED RINGERS IV SOLN
INTRAVENOUS | Status: DC | PRN
  Administered 2013-11-23 (×2): via INTRAVENOUS

## 2013-11-23 MED ORDER — PANTOPRAZOLE SODIUM 40 MG PO TBEC
40.0000 mg | DELAYED_RELEASE_TABLET | Freq: Every day | ORAL | Status: DC
  Administered 2013-11-24: 40 mg via ORAL
  Filled 2013-11-23: qty 1

## 2013-11-23 MED ORDER — THROMBIN 20000 UNITS EX SOLR
CUTANEOUS | Status: DC | PRN
  Administered 2013-11-23: 13:00:00 via TOPICAL

## 2013-11-23 MED ORDER — ALPRAZOLAM 0.5 MG PO TABS: 0.5000 mg | ORAL_TABLET | Freq: Two times a day (BID) | ORAL | Status: DC | PRN

## 2013-11-23 MED ORDER — SCOPOLAMINE 1 MG/3DAYS TD PT72
MEDICATED_PATCH | TRANSDERMAL | Status: AC
  Filled 2013-11-23: qty 1

## 2013-11-23 MED ORDER — FLUTICASONE PROPIONATE 50 MCG/ACT NA SUSP
1.0000 | Freq: Every day | NASAL | Status: DC
  Administered 2013-11-24: 1 via NASAL
  Filled 2013-11-23: qty 16

## 2013-11-23 MED ORDER — GLYCOPYRROLATE 0.2 MG/ML IJ SOLN
INTRAMUSCULAR | Status: DC | PRN
  Administered 2013-11-23: 0.4 mg via INTRAVENOUS

## 2013-11-23 MED ORDER — INFLUENZA VAC SPLIT QUAD 0.5 ML IM SUSY
0.5000 mL | PREFILLED_SYRINGE | INTRAMUSCULAR | Status: DC
  Filled 2013-11-23: qty 0.5

## 2013-11-23 MED ORDER — 0.9 % SODIUM CHLORIDE (POUR BTL) OPTIME
TOPICAL | Status: DC | PRN
  Administered 2013-11-23: 1000 mL

## 2013-11-23 MED ORDER — BUPIVACAINE HCL (PF) 0.25 % IJ SOLN
INTRAMUSCULAR | Status: DC | PRN
  Administered 2013-11-23: 4 mL

## 2013-11-23 MED ORDER — LINAGLIPTIN 5 MG PO TABS
5.0000 mg | ORAL_TABLET | Freq: Every day | ORAL | Status: DC
  Administered 2013-11-23 – 2013-11-24 (×2): 5 mg via ORAL
  Filled 2013-11-23 (×2): qty 1

## 2013-11-23 MED ORDER — ONDANSETRON HCL 4 MG/2ML IJ SOLN
INTRAMUSCULAR | Status: DC | PRN
  Administered 2013-11-23: 4 mg via INTRAVENOUS

## 2013-11-23 MED ORDER — SODIUM CHLORIDE 0.9 % IR SOLN
Status: DC | PRN
  Administered 2013-11-23: 13:00:00

## 2013-11-23 MED ORDER — LIDOCAINE HCL (CARDIAC) 20 MG/ML IV SOLN
INTRAVENOUS | Status: DC | PRN
  Administered 2013-11-23: 100 mg via INTRAVENOUS

## 2013-11-23 MED ORDER — POLYVINYL ALCOHOL 1.4 % OP SOLN
1.0000 [drp] | OPHTHALMIC | Status: DC | PRN
  Filled 2013-11-23: qty 15

## 2013-11-23 MED ORDER — SODIUM CHLORIDE 0.9 % IJ SOLN: 3.0000 mL | INTRAMUSCULAR | Status: DC | PRN

## 2013-11-23 MED ORDER — PROPOFOL 10 MG/ML IV BOLUS
INTRAVENOUS | Status: DC | PRN
  Administered 2013-11-23: 200 mg via INTRAVENOUS

## 2013-11-23 MED ORDER — SODIUM CHLORIDE 0.9 % IJ SOLN
3.0000 mL | Freq: Two times a day (BID) | INTRAMUSCULAR | Status: DC
  Administered 2013-11-23: 3 mL via INTRAVENOUS

## 2013-11-23 MED ORDER — MENTHOL 3 MG MT LOZG: 1.0000 | LOZENGE | OROMUCOSAL | Status: DC | PRN

## 2013-11-23 SURGICAL SUPPLY — 70 items
APL SKNCLS STERI-STRIP NONHPOA (GAUZE/BANDAGES/DRESSINGS) ×1
BAG DECANTER FOR FLEXI CONT (MISCELLANEOUS) ×2 IMPLANT
BENZOIN TINCTURE PRP APPL 2/3 (GAUZE/BANDAGES/DRESSINGS) ×2 IMPLANT
BLADE SURG ROTATE 9660 (MISCELLANEOUS) IMPLANT
BONE MATRIX OSTEOCEL PRO SM (Bone Implant) ×1 IMPLANT
BUR MATCHSTICK NEURO 3.0 LAGG (BURR) ×2 IMPLANT
CAGE COROENT 12X9X23-8 (Cage) ×2 IMPLANT
CANISTER SUCT 3000ML (MISCELLANEOUS) ×2 IMPLANT
CLIP NEUROVISION LG (CLIP) ×1 IMPLANT
CONT SPEC 4OZ CLIKSEAL STRL BL (MISCELLANEOUS) ×3 IMPLANT
COVER BACK TABLE 24X17X13 BIG (DRAPES) IMPLANT
COVER TABLE BACK 60X90 (DRAPES) ×2 IMPLANT
DRAPE C-ARM 42X72 X-RAY (DRAPES) ×2 IMPLANT
DRAPE C-ARMOR (DRAPES) ×2 IMPLANT
DRAPE LAPAROTOMY 100X72X124 (DRAPES) ×2 IMPLANT
DRAPE POUCH INSTRU U-SHP 10X18 (DRAPES) ×2 IMPLANT
DRAPE SURG 17X23 STRL (DRAPES) ×2 IMPLANT
DRSG OPSITE 4X5.5 SM (GAUZE/BANDAGES/DRESSINGS) ×4 IMPLANT
DRSG OPSITE POSTOP 4X6 (GAUZE/BANDAGES/DRESSINGS) ×1 IMPLANT
DRSG TELFA 3X8 NADH (GAUZE/BANDAGES/DRESSINGS) IMPLANT
DURAPREP 26ML APPLICATOR (WOUND CARE) ×2 IMPLANT
ELECT REM PT RETURN 9FT ADLT (ELECTROSURGICAL) ×2
ELECTRODE REM PT RTRN 9FT ADLT (ELECTROSURGICAL) ×1 IMPLANT
EVACUATOR 1/8 PVC DRAIN (DRAIN) ×1 IMPLANT
GAUZE SPONGE 4X4 16PLY XRAY LF (GAUZE/BANDAGES/DRESSINGS) IMPLANT
GLOVE BIO SURGEON STRL SZ8 (GLOVE) ×4 IMPLANT
GLOVE BIOGEL M 7.0 STRL (GLOVE) ×2 IMPLANT
GLOVE BIOGEL PI IND STRL 7.0 (GLOVE) IMPLANT
GLOVE BIOGEL PI INDICATOR 7.0 (GLOVE) ×3
GLOVE SURG SS PI 7.0 STRL IVOR (GLOVE) ×2 IMPLANT
GOWN STRL REUS W/ TWL LRG LVL3 (GOWN DISPOSABLE) IMPLANT
GOWN STRL REUS W/ TWL XL LVL3 (GOWN DISPOSABLE) ×2 IMPLANT
GOWN STRL REUS W/TWL 2XL LVL3 (GOWN DISPOSABLE) IMPLANT
GOWN STRL REUS W/TWL LRG LVL3 (GOWN DISPOSABLE)
GOWN STRL REUS W/TWL XL LVL3 (GOWN DISPOSABLE) ×6
HEMOSTAT POWDER KIT SURGIFOAM (HEMOSTASIS) ×1 IMPLANT
KIT BASIN OR (CUSTOM PROCEDURE TRAY) ×2 IMPLANT
KIT NDL NVM5 EMG ELECT (KITS) IMPLANT
KIT NEEDLE NVM5 EMG ELECT (KITS) ×1 IMPLANT
KIT NEEDLE NVM5 EMG ELECTRODE (KITS) ×1
KIT ROOM TURNOVER OR (KITS) ×2 IMPLANT
MILL MEDIUM DISP (BLADE) ×1 IMPLANT
NDL HYPO 25X1 1.5 SAFETY (NEEDLE) ×1 IMPLANT
NEEDLE HYPO 25X1 1.5 SAFETY (NEEDLE) ×2 IMPLANT
NS IRRIG 1000ML POUR BTL (IV SOLUTION) ×2 IMPLANT
PACK LAMINECTOMY NEURO (CUSTOM PROCEDURE TRAY) ×2 IMPLANT
PAD ARMBOARD 7.5X6 YLW CONV (MISCELLANEOUS) ×6 IMPLANT
PAD DRESSING TELFA 3X8 NADH (GAUZE/BANDAGES/DRESSINGS) ×1 IMPLANT
PUTTY STIMUBLAST 2.5CC (Putty) ×1 IMPLANT
ROD 5.5X40MM (Rod) ×2 IMPLANT
SCREW LOCK (Screw) ×8 IMPLANT
SCREW LOCK FXNS SPNE MAS PL (Screw) IMPLANT
SCREW MAS PLIF 5.5X30 (Screw) ×2 IMPLANT
SCREW PAS PLIF 5X30 (Screw) IMPLANT
SCREW SHANK 5.0X30MM (Screw) ×2 IMPLANT
SCREW TULIP 5.5 (Screw) ×2 IMPLANT
SPONGE LAP 4X18 X RAY DECT (DISPOSABLE) IMPLANT
SPONGE SURGIFOAM ABS GEL 100 (HEMOSTASIS) ×2 IMPLANT
STRIP CLOSURE SKIN 1/2X4 (GAUZE/BANDAGES/DRESSINGS) ×4 IMPLANT
SUT VIC AB 0 CT1 18XCR BRD8 (SUTURE) ×1 IMPLANT
SUT VIC AB 0 CT1 8-18 (SUTURE) ×2
SUT VIC AB 2-0 CP2 18 (SUTURE) ×2 IMPLANT
SUT VIC AB 3-0 SH 8-18 (SUTURE) ×4 IMPLANT
SYR 20ML ECCENTRIC (SYRINGE) ×2 IMPLANT
SYR 3ML LL SCALE MARK (SYRINGE) ×3 IMPLANT
TOWEL OR 17X24 6PK STRL BLUE (TOWEL DISPOSABLE) ×2 IMPLANT
TOWEL OR 17X26 10 PK STRL BLUE (TOWEL DISPOSABLE) ×2 IMPLANT
TRAY FOLEY CATH 14FRSI W/METER (CATHETERS) ×1 IMPLANT
TRAY FOLEY CATH 16FRSI W/METER (SET/KITS/TRAYS/PACK) ×1 IMPLANT
WATER STERILE IRR 1000ML POUR (IV SOLUTION) ×2 IMPLANT

## 2013-11-23 NOTE — Progress Notes (Signed)
ANTIBIOTIC CONSULT NOTE - INITIAL  Pharmacy Consult for vancomycin Indication: surgical prophylaxis  Allergies  Allergen Reactions  . Penicillins Rash    Patient Measurements: Weight: 147 lb 11.3 oz (67 kg)  Vital Signs: Temp: 98 F (36.7 C) (09/23 1616) Temp src: Oral (09/23 0923) BP: 128/76 mmHg (09/23 1616) Pulse Rate: 98 (09/23 1616) Intake/Output from previous day:   Intake/Output from this shift: Total I/O In: 1000 [I.V.:1000] Out: 675 [Urine:575; Blood:100]  Labs: No results found for this basename: WBC, HGB, PLT, LABCREA, CREATININE,  in the last 72 hours The CrCl is unknown because both a height and weight (above a minimum accepted value) are required for this calculation. No results found for this basename: VANCOTROUGH, Corlis Leak, VANCORANDOM, GENTTROUGH, GENTPEAK, GENTRANDOM, TOBRATROUGH, TOBRAPEAK, TOBRARND, AMIKACINPEAK, AMIKACINTROU, AMIKACIN,  in the last 72 hours   Microbiology: Recent Results (from the past 720 hour(s))  SURGICAL PCR SCREEN     Status: None   Collection Time    11/11/13  8:49 AM      Result Value Ref Range Status   MRSA, PCR NEGATIVE  NEGATIVE Final   Staphylococcus aureus NEGATIVE  NEGATIVE Final   Comment:            The Xpert SA Assay (FDA     approved for NASAL specimens     in patients over 41 years of age),     is one component of     a comprehensive surveillance     program.  Test performance has     been validated by [employer] for patients greater     than or equal to 22 year old.     It is not intended     to diagnose infection nor to     guide or monitor treatment.    Medical History: Past Medical History  Diagnosis Date  . Insomnia   . Allergy   . Anxiety   . Hyperlipidemia   . GERD (gastroesophageal reflux disease)   . Hiatal hernia   . Abdominal pain   . Arthritis   . Gallbladder disease   . Nausea alone   . Enlarged prostate   . Nocturia   . Diabetes mellitus without complication   . Pneumonia      hx  . Anemia     hx  Assessment: 63 year old male s/p spinal surgery. No drain was left in place. Orders received for post-op vancomycin dose x 1. Renal function normal from preadmission labs.   Goal of Therapy:  Prophylaxis of infection  Plan:  Vancomycin 1g IV x1 tonight Pharmacy to s/off - no further dosing needed  Erin Hearing PharmD., BCPS Clinical Pharmacist Pager [phone] 11/23/2013 4:58 PM

## 2013-11-23 NOTE — Op Note (Signed)
11/23/2013  1:46 PM  PATIENT:  Hunter Bridges  63 y.o. 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 Decompressive lumbar laminectomy L4-5 (Gill-type) requiring more work than would be required for a simple exposure of the disk for PLIF in order to adequately decompress the neural elements and address the spinal stenosis 2. Posterior lumbar interbody fusion L4-5 using PEEK interbody cages packed with morcellized allograft and autograft 3. Posterior fixation L4-5 using cortical pedicle screws.  4. Intertransverse arthrodesis L4-5 on the using morcellized autograft and allograft.  SURGEON:  Sherley Bounds, MD  ASSISTANTS: Dr. Christella Noa  ANESTHESIA:  General  EBL: 100 ml  Total I/O In: 1000 [I.V.:1000] Out: 425 [Urine:325; Blood:100]  BLOOD ADMINISTERED:none  DRAINS: Hemovac   INDICATION FOR PROCEDURE: This patient presented with a long history of back and leg pain. Plain films with flexion extension views showed a mobile spondylolisthesis at L4-5. He had spinal stenosis at this level found on MRI. He tried medical management without relief. His pain interfered with his quality of life in his ability to work. I recommended decompression and instrumented fusion to address his spinal stenosis and segmental instability. Patient understood the risks, benefits, and alternatives and potential outcomes and wished to proceed.  PROCEDURE DETAILS:  The patient was brought to the operating room. After induction of generalized endotracheal anesthesia the patient was rolled into the prone position on chest rolls and all pressure points were padded. The patient's lumbar region was cleaned and then prepped with DuraPrep and draped in the usual sterile fashion. Anesthesia was injected and then a dorsal midline incision was made and carried down to the lumbosacral fascia. The fascia was  opened and the paraspinous musculature was taken down in a subperiosteal fashion to expose L4-5. A self-retaining retractor was placed. Intraoperative fluoroscopy confirmed my level, and I started with placement of the L4 cortical pedicle screws. The pedicle screw entry zones were identified utilizing surface landmarks and  AP and lateral fluoroscopy. I scored the cortex with the high-speed drill and then used the hand drill and EMG monitoring to drill an upward and outward direction into the pedicle. I then tapped line to line, and the tap was also monitored. I then placed a 5-0 x 30 mm cortical pedicle screw into the pedicles of L4 bilaterally. I then turned my attention to the decompression and the spinous process was removed and complete lumbar laminectomies, hemi- facetectomies, and foraminotomies were performed at L4-5. The patient had significant spinal stenosis and this required more work than would be required for a simple exposure of the disc for posterior lumbar interbody fusion. Much more generous decompression was undertaken in order to adequately decompress the neural elements and address the patient's leg pain. The yellow ligament was removed to expose the underlying dura and nerve roots, and generous foraminotomies were performed to adequately decompress the neural elements. Both the exiting and traversing nerve roots were decompressed on both sides until a coronary dilator passed easily along the nerve roots. Once the decompression was complete, I turned my attention to the posterior lower lumbar interbody fusion. The epidural venous vasculature was coagulated and cut sharply. Disc space was incised and the initial discectomy was performed with pituitary rongeurs. The disc space was distracted with sequential distractors to a height of 12 mm. We then used a series of scrapers and shavers to prepare the endplates for fusion. The  midline was prepared with Epstein curettes. Once the complete discectomy  was finished, we packed an appropriate sized peek interbody cage 12 mm x 8 by 23 mm with local autograft and morcellized allograft, gently retracted the nerve root, and tapped the cage into position at L4-5.  The midline between the cages was packed with morselized autograft and allograft. We then turned our attention to the placement of the lower pedicle screws. The pedicle screw entry zones were identified utilizing surface landmarks and fluoroscopy. I drilled into each pedicle utilizing the hand drill and EMG monitoring, and tapped each pedicle with the appropriate tap. We palpated with a ball probe to assure no break in the cortex. We then placed 5-0 x 30 mm cortical pedicle screws into the pedicles bilaterally at L5. We then decorticated the transverse processes and laid a mixture of morcellized autograft and allograft out over these to perform intertransverse arthrodesis at L4-5 on the right. We then placed lordotic rods into the multiaxial screw heads of the pedicle screws and locked these in position with the locking caps and anti-torque device. We then checked our construct with AP and lateral fluoroscopy. Irrigated with copious amounts of bacitracin-containing saline solution. Placed a medium Hemovac drain through separate stab incision. Inspected the nerve roots once again to assure adequate decompression, lined to the dura with Gelfoam, and closed the muscle and the fascia with 0 Vicryl. Closed the subcutaneous tissues with 2-0 Vicryl and subcuticular tissues with 3-0 Vicryl. The skin was closed with benzoin and Steri-Strips. Dressing was then applied, the patient was awakened from general anesthesia and transported to the recovery room in stable condition. At the end of the procedure all sponge, needle and instrument counts were correct.   PLAN OF CARE: Admit to inpatient   PATIENT DISPOSITION:  PACU - hemodynamically stable.   Delay start of Pharmacological VTE agent (>24hrs) due to surgical  blood loss or risk of bleeding:  yes

## 2013-11-23 NOTE — Anesthesia Preprocedure Evaluation (Signed)
Anesthesia Evaluation  Patient identified by MRN, date of birth, ID band Patient awake    Reviewed: Allergy & Precautions, H&P , NPO status , Patient's Chart, lab work & pertinent test results  Airway       Dental   Pulmonary former smoker,          Cardiovascular     Neuro/Psych    GI/Hepatic hiatal hernia, GERD-  ,  Endo/Other  diabetes, Type 2, Oral Hypoglycemic Agents  Renal/GU      Musculoskeletal   Abdominal   Peds  Hematology   Anesthesia Other Findings   Reproductive/Obstetrics                           Anesthesia Physical Anesthesia Plan  ASA: III  Anesthesia Plan: General   Post-op Pain Management:    Induction: Intravenous  Airway Management Planned: Oral ETT  Additional Equipment:   Intra-op Plan:   Post-operative Plan: Extubation in OR  Informed Consent: I have reviewed the patients History and Physical, chart, labs and discussed the procedure including the risks, benefits and alternatives for the proposed anesthesia with the patient or authorized representative who has indicated his/her understanding and acceptance.     Plan Discussed with: CRNA, Anesthesiologist and Surgeon  Anesthesia Plan Comments:         Anesthesia Quick Evaluation

## 2013-11-23 NOTE — Progress Notes (Signed)
Utilization review completed.  

## 2013-11-23 NOTE — H&P (Signed)
Subjective: Patient is a 63 y.o. male admitted for PLIF L4-5. Onset of symptoms was several months ago, gradually worsening since that time.  The pain is rated severe, and is located at the across the lower back and radiates to legs. The pain is described as aching and occurs all day. The symptoms have been progressive. Symptoms are exacerbated by exercise and standing. MRI or CT showed spondylolisthesis with stenosis l4-5   Past Medical History  Diagnosis Date  . Insomnia   . Allergy   . Anxiety   . Hyperlipidemia   . GERD (gastroesophageal reflux disease)   . Hiatal hernia   . Abdominal pain   . Arthritis   . Gallbladder disease   . Nausea alone   . Enlarged prostate   . Nocturia   . Diabetes mellitus without complication   . Pneumonia     hx  . Anemia     hx    Past Surgical History  Procedure Laterality Date  . Colonoscopy  2013  . Chest tube insertion  2006    DUE TO FX RIBS  . Transurethral resection of prostate  2006  . Cholecystectomy  01/12/2012    Procedure: LAPAROSCOPIC CHOLECYSTECTOMY;  Surgeon: Harl Bowie, MD;  Location: [facility] ORS;  Service: General;;  . Eye surgery  2012,15    strabismus,cataracts  . Cervical disc surgery  14  . Cardiac catheterization  05    nml    Prior to Admission medications   Medication Sig Start Date End Date Taking? Authorizing Provider  acetaminophen (TYLENOL) 500 MG tablet Take 500 mg by mouth every 6 (six) hours as needed for moderate pain.    Yes Historical Provider, MD  ALPRAZolam Duanne Moron) 0.5 MG tablet Take 0.5 mg by mouth 2 (two) times daily as needed. Anxiety 10/25/11  Yes Historical Provider, MD  DEXILANT 60 MG capsule Take 60 mg by mouth daily.  12/03/11  Yes Historical Provider, MD  diclofenac sodium (VOLTAREN) 1 % GEL Apply 2 g topically 4 (four) times daily as needed (for pain).   Yes Historical Provider, MD  diphenhydrAMINE (BENADRYL) 25 MG tablet Take 50 mg by mouth every 6 (six) hours as needed. Allergies   Yes  Historical Provider, MD  HYDROcodone-acetaminophen (NORCO) 5-325 MG per tablet Take 1-2 tablets by mouth every 4 (four) hours as needed for pain. 01/12/12  Yes Coralie Keens, MD  linagliptin (TRADJENTA) 5 MG TABS tablet Take 5 mg by mouth daily.   Yes Historical Provider, MD  loratadine (CLARITIN) 10 MG tablet Take 10 mg by mouth every morning.   Yes Historical Provider, MD  meloxicam (MOBIC) 15 MG tablet Take 15 mg by mouth daily.  12/04/11  Yes Historical Provider, MD  methocarbamol (ROBAXIN) 500 MG tablet Take 500 mg by mouth every 8 (eight) hours as needed (for lower back and leg pain).   Yes Historical Provider, MD  methylcellulose (ARTIFICIAL TEARS) 1 % ophthalmic solution Place 1 drop into both eyes 3 (three) times daily as needed. Dry eyes   Yes Historical Provider, MD  mometasone (NASONEX) 50 MCG/ACT nasal spray Place 1 spray into both nostrils daily.   Yes Historical Provider, MD  Multiple Vitamins-Minerals (CENTRUM SILVER PO) Take 1 tablet by mouth daily.   Yes Historical Provider, MD  omega-3 acid ethyl esters (LOVAZA) 1 G capsule Take 1 g by mouth daily.    Yes Historical Provider, MD  oxymetazoline (AFRIN) 0.05 % nasal spray Place 1 spray into both nostrils 2 (two) times  daily as needed. Allergies   Yes Historical Provider, MD  pentosan polysulfate (ELMIRON) 100 MG capsule Take 100 mg by mouth 4 (four) times daily.   Yes Historical Provider, MD  phenylephrine (SUDAFED PE) 10 MG TABS tablet Take 10 mg by mouth every 4 (four) hours as needed (for congestion).   Yes Historical Provider, MD  psyllium (REGULOID) 0.52 G capsule Take 1.56 g by mouth daily.    Yes Historical Provider, MD  simvastatin (ZOCOR) 80 MG tablet Take 80 mg by mouth every morning.  12/03/11  Yes Historical Provider, MD  solifenacin (VESICARE) 5 MG tablet Take 5 mg by mouth daily.   Yes Historical Provider, MD  zolpidem (AMBIEN) 10 MG tablet Take 10 mg by mouth at bedtime.  10/25/11  Yes Historical Provider, MD  ZUPLENZ  8 MG FILM Place 8 mg under the tongue 2 (two) times daily as needed. Nausea 12/07/11  Yes Historical Provider, MD  albuterol (PROVENTIL HFA;VENTOLIN HFA) 108 (90 BASE) MCG/ACT inhaler Inhale 1 puff into the lungs every 6 (six) hours as needed for wheezing or shortness of breath.    Historical Provider, MD   Allergies  Allergen Reactions  . Penicillins Rash    History  Substance Use Topics  . Smoking status: Former Smoker -- 0.25 packs/day for 10 years    Types: Cigarettes    Quit date: 01/08/1984  . Smokeless tobacco: Not on file  . Alcohol Use: No    Family History  Problem Relation Age of Onset  . Cancer Mother     colon     Review of Systems  Positive ROS: neg  All other systems have been reviewed and were otherwise negative with the exception of those mentioned in the HPI and as above.  Objective: Vital signs in last 24 hours: Temp:  [98.4 F (36.9 C)] 98.4 F (36.9 C) (09/23 0923) Pulse Rate:  [94] 94 (09/23 0923) Resp:  [18] 18 (09/23 0923) BP: (120)/(77) 120/77 mmHg (09/23 0923) SpO2:  [100 %] 100 % (09/23 0923)  General Appearance: Alert, cooperative, no distress, appears stated age Head: Normocephalic, without obvious abnormality, atraumatic Eyes: PERRL, conjunctiva/corneas clear, EOM's intact    Neck: Supple, symmetrical, trachea midline Back: Symmetric, no curvature, ROM normal, no CVA tenderness Lungs:  respirations unlabored Heart: Regular rate and rhythm Abdomen: Soft, non-tender Extremities: Extremities normal, atraumatic, no cyanosis or edema Pulses: 2+ and symmetric all extremities Skin: Skin color, texture, turgor normal, no rashes or lesions  NEUROLOGIC:   Mental status: Alert and oriented x4,  no aphasia, good attention span, fund of knowledge, and memory Motor Exam - grossly normal Sensory Exam - grossly normal Reflexes: 1+ Coordination - grossly normal Gait - grossly normal Balance - grossly normal Cranial Nerves: I: smell Not tested   II: visual acuity  OS: nl    OD: nl  II: visual fields Full to confrontation  II: pupils Equal, round, reactive to light  III,VII: ptosis None  III,IV,VI: extraocular muscles  Full ROM  V: mastication Normal  V: facial light touch sensation  Normal  V,VII: corneal reflex  Present  VII: facial muscle function - upper  Normal  VII: facial muscle function - lower Normal  VIII: hearing Not tested  IX: soft palate elevation  Normal  IX,X: gag reflex Present  XI: trapezius strength  5/5  XI: sternocleidomastoid strength 5/5  XI: neck flexion strength  5/5  XII: tongue strength  Normal    Data Review Lab Results  Component Value  Date   WBC 4.0 11/11/2013   HGB 12.7* 11/11/2013   HCT 38.6* 11/11/2013   MCV 86.0 11/11/2013   PLT 180 11/11/2013   Lab Results  Component Value Date   NA 134* 11/11/2013   K 4.6 11/11/2013   CL 99 11/11/2013   CO2 25 11/11/2013   BUN 10 11/11/2013   CREATININE 0.74 11/11/2013   GLUCOSE 140* 11/11/2013   Lab Results  Component Value Date   INR 1.06 11/11/2013    Assessment/Plan: Patient admitted for PLIF l4-5. Patient has failed a reasonable attempt at conservative therapy.  I explained the condition and procedure to the patient and answered any questions.  Patient wishes to proceed with procedure as planned. Understands risks/ benefits and typical outcomes of procedure.   , S 11/23/2013 11:23 AM

## 2013-11-23 NOTE — Anesthesia Procedure Notes (Signed)
Procedure Name: Intubation Date/Time: 11/23/2013 11:53 AM Performed by: Scheryl Darter Pre-anesthesia Checklist: Emergency Drugs available, Patient identified, Suction available, Patient being monitored and Timeout performed Patient Re-evaluated:Patient Re-evaluated prior to inductionOxygen Delivery Method: Circle system utilized Preoxygenation: Pre-oxygenation with 100% oxygen Intubation Type: IV induction Ventilation: Mask ventilation without difficulty Laryngoscope Size: Mac Grade View: Grade I Tube type: Oral Tube size: 7.5 mm Number of attempts: 1 Airway Equipment and Method: Stylet Placement Confirmation: ETT inserted through vocal cords under direct vision,  positive ETCO2 and breath sounds checked- equal and bilateral Secured at: 23 cm Tube secured with: Tape Dental Injury: Teeth and Oropharynx as per pre-operative assessment

## 2013-11-24 LAB — GLUCOSE, CAPILLARY
GLUCOSE-CAPILLARY: 102 mg/dL — AB (ref 70–99)
Glucose-Capillary: 126 mg/dL — ABNORMAL HIGH (ref 70–99)
Glucose-Capillary: 139 mg/dL — ABNORMAL HIGH (ref 70–99)

## 2013-11-24 MED ORDER — METHOCARBAMOL 500 MG PO TABS: 500.0000 mg | ORAL_TABLET | Freq: Three times a day (TID) | ORAL | Status: AC | PRN

## 2013-11-24 MED ORDER — OXYCODONE-ACETAMINOPHEN 5-325 MG PO TABS: 1.0000 | ORAL_TABLET | ORAL | Status: DC | PRN

## 2013-11-24 NOTE — Progress Notes (Signed)
Patient alert and oriented, mae's well, voiding adequate amount of urine, swallowing without difficulty, c/o moderate pain and meds given prior to discharged. Patient discharged home with family. Script and discharged instructions given to patient. Patient and family stated understanding of instructions given.  

## 2013-11-24 NOTE — Discharge Summary (Signed)
Physician Discharge Summary  Patient ID: Hunter Bridges MRN: [id number] DOB/AGE: 09-14-1950 63 y.o.  Admit date: 11/23/2013 Discharge date: 11/24/2013  Admission Diagnoses: spondylolisthesis   Discharge Diagnoses: same   Discharged Condition: good  Hospital Course: The patient was admitted on 11/23/2013 and taken to the operating room where the patient underwent PLIF L4-5. The patient tolerated the procedure well and was taken to the recovery room and then to the floor in stable condition. The hospital course was routine. There were no complications. The wound remained clean dry and intact. Pt had appropriate back soreness. No complaints of leg pain or new N/T/W. The patient remained afebrile with stable vital signs, and tolerated a regular diet. The patient continued to increase activities, and pain was well controlled with oral pain medications.   Consults: None  Significant Diagnostic Studies:  Results for orders placed during the hospital encounter of 11/23/13  GLUCOSE, CAPILLARY      Result Value Ref Range   Glucose-Capillary 115 (*) 70 - 99 mg/dL  GLUCOSE, CAPILLARY      Result Value Ref Range   Glucose-Capillary 169 (*) 70 - 99 mg/dL   Comment 1 Notify RN     Comment 2 Documented in Chart    GLUCOSE, CAPILLARY      Result Value Ref Range   Glucose-Capillary 145 (*) 70 - 99 mg/dL  GLUCOSE, CAPILLARY      Result Value Ref Range   Glucose-Capillary 126 (*) 70 - 99 mg/dL  GLUCOSE, CAPILLARY      Result Value Ref Range   Glucose-Capillary 139 (*) 70 - 99 mg/dL    Chest 2 View  11/11/2013   CLINICAL DATA:  Preoperative examination (spinal surgery) history of hiatal hernia, GERD, pneumonia, diabetes  EXAM: CHEST  2 VIEW  COMPARISON:  01/08/2012; 02/08/2008  FINDINGS: Grossly unchanged cardiac silhouette and mediastinal contours. There is chronic elevation of the right hemidiaphragm. No focal airspace opacities. No pleural effusion or pneumothorax. No evidence of edema. No  acute osseus abnormalities. Unchanged bones. Post cholecystectomy.  IMPRESSION: 1.  No acute cardiopulmonary disease. 2. Chronic elevation of the right hemidiaphragm.   Electronically Signed   By: Sandi Mariscal M.D.   On: 11/11/2013 09:08   Dg Lumbar Spine 2-3 Views  11/23/2013   CLINICAL DATA:  Low back pain.  EXAM: DG C-ARM 61-120 MIN; LUMBAR SPINE - 2-3 VIEW  FLUOROSCOPY TIME:  See operative report.  COMPARISON:  Multiple priors.  FINDINGS: C-arm films document L4-5 posterolateral and interbody fusion.  IMPRESSION: Satisfactory position and alignment.   Electronically Signed   By: Rolla Flatten M.D.   On: 11/23/2013 13:47   Dg C-arm 1-60 Min  11/23/2013   CLINICAL DATA:  Low back pain.  EXAM: DG C-ARM 61-120 MIN; LUMBAR SPINE - 2-3 VIEW  FLUOROSCOPY TIME:  See operative report.  COMPARISON:  Multiple priors.  FINDINGS: C-arm films document L4-5 posterolateral and interbody fusion.  IMPRESSION: Satisfactory position and alignment.   Electronically Signed   By: Rolla Flatten M.D.   On: 11/23/2013 13:47    Antibiotics:  Anti-infectives   Start     Dose/Rate Route Frequency Ordered Stop   11/24/13 0000  vancomycin (VANCOCIN) IVPB 1000 mg/200 mL premix     1,000 mg 200 mL/hr over 60 Minutes Intravenous  Once 11/23/13 1656 11/24/13 0028   11/23/13 1237  bacitracin 50,000 Units in sodium chloride irrigation 0.9 % 500 mL irrigation  Status:  Discontinued       As  needed 11/23/13 1238 11/23/13 1357   11/23/13 0600  vancomycin (VANCOCIN) IVPB 1000 mg/200 mL premix     1,000 mg 200 mL/hr over 60 Minutes Intravenous On call to O.R. 11/22/13 1425 11/23/13 1156      Discharge Exam: Blood pressure 104/71, pulse 84, temperature 98.7 F (37.1 C), temperature source Oral, resp. rate 17, weight 67 kg (147 lb 11.3 oz), SpO2 98.00%. Neurologic: Grossly normal Incision CDI  Discharge Medications:     Medication List    STOP taking these medications       HYDROcodone-acetaminophen 5-325 MG per tablet   Commonly known as:  NORCO      TAKE these medications       acetaminophen 500 MG tablet  Commonly known as:  TYLENOL  Take 500 mg by mouth every 6 (six) hours as needed for moderate pain.     albuterol 108 (90 BASE) MCG/ACT inhaler  Commonly known as:  PROVENTIL HFA;VENTOLIN HFA  Inhale 1 puff into the lungs every 6 (six) hours as needed for wheezing or shortness of breath.     ALPRAZolam 0.5 MG tablet  Commonly known as:  XANAX  Take 0.5 mg by mouth 2 (two) times daily as needed. Anxiety     CENTRUM SILVER PO  Take 1 tablet by mouth daily.     DEXILANT 60 MG capsule  Generic drug:  dexlansoprazole  Take 60 mg by mouth daily.     diclofenac sodium 1 % Gel  Commonly known as:  VOLTAREN  Apply 2 g topically 4 (four) times daily as needed (for pain).     diphenhydrAMINE 25 MG tablet  Commonly known as:  BENADRYL  Take 50 mg by mouth every 6 (six) hours as needed. Allergies     linagliptin 5 MG Tabs tablet  Commonly known as:  TRADJENTA  Take 5 mg by mouth daily.     loratadine 10 MG tablet  Commonly known as:  CLARITIN  Take 10 mg by mouth every morning.     meloxicam 15 MG tablet  Commonly known as:  MOBIC  Take 15 mg by mouth daily.     methocarbamol 500 MG tablet  Commonly known as:  ROBAXIN  Take 1 tablet (500 mg total) by mouth every 8 (eight) hours as needed (for lower back and leg pain).     methylcellulose 1 % ophthalmic solution  Commonly known as:  ARTIFICIAL TEARS  Place 1 drop into both eyes 3 (three) times daily as needed. Dry eyes     mometasone 50 MCG/ACT nasal spray  Commonly known as:  NASONEX  Place 1 spray into both nostrils daily.     omega-3 acid ethyl esters 1 G capsule  Commonly known as:  LOVAZA  Take 1 g by mouth daily.     oxyCODONE-acetaminophen 5-325 MG per tablet  Commonly known as:  PERCOCET/ROXICET  Take 1-2 tablets by mouth every 4 (four) hours as needed for moderate pain.     oxymetazoline 0.05 % nasal spray  Commonly  known as:  AFRIN  Place 1 spray into both nostrils 2 (two) times daily as needed. Allergies     pentosan polysulfate 100 MG capsule  Commonly known as:  ELMIRON  Take 100 mg by mouth 4 (four) times daily.     phenylephrine 10 MG Tabs tablet  Commonly known as:  SUDAFED PE  Take 10 mg by mouth every 4 (four) hours as needed (for congestion).     psyllium 0.52 G  capsule  Commonly known as:  REGULOID  Take 1.56 g by mouth daily.     simvastatin 80 MG tablet  Commonly known as:  ZOCOR  Take 80 mg by mouth every morning.     solifenacin 5 MG tablet  Commonly known as:  VESICARE  Take 5 mg by mouth daily.     zolpidem 10 MG tablet  Commonly known as:  AMBIEN  Take 10 mg by mouth at bedtime.     ZUPLENZ 8 MG Film  Generic drug:  Ondansetron  Place 8 mg under the tongue 2 (two) times daily as needed. Nausea        Disposition: home   Final Dx: PLIF L4-5      Discharge Instructions    Remove dressing in 72 hours    Complete by:  As directed      Call MD for:  difficulty breathing, headache or visual disturbances    Complete by:  As directed      Call MD for:  persistant nausea and vomiting    Complete by:  As directed      Call MD for:  redness, tenderness, or signs of infection (pain, swelling, redness, odor or green/yellow discharge around incision site)    Complete by:  As directed      Call MD for:  severe uncontrolled pain    Complete by:  As directed      Call MD for:  temperature >100.4    Complete by:  As directed      Diet - low sodium heart healthy    Complete by:  As directed      Discharge instructions    Complete by:  As directed   No strenuous activity, no bending or twisting, no driving, may shower     Increase activity slowly    Complete by:  As directed               Signed: , S 11/24/2013, 8:09 AM

## 2013-11-24 NOTE — Evaluation (Signed)
Physical Therapy Evaluation and Discharge Patient Details Name: Hunter Bridges MRN: [id number] DOB: [birth year] Today's Date: 11/24/2013   History of Present Illness  63 yo s/p L4-5 MAS PLIF  Clinical Impression  Patient evaluated by Physical Therapy with no further acute PT needs identified. All education has been completed and the patient has no further questions. At the time of PT eval pt was educated on back precautions and recommendations for mobility/positioning at home. He demonstrated modified independence with transfers and ambulation. See below for any follow-up Physial Therapy or equipment needs. PT is signing off. Thank you for this referral.     Follow Up Recommendations No PT follow up    Equipment Recommendations  None recommended by PT    Recommendations for Other Services       Precautions / Restrictions Precautions Precautions: Back Precaution Booklet Issued: Yes (comment) Required Braces or Orthoses: Spinal Brace Spinal Brace: Applied in sitting position;Lumbar corset Restrictions Weight Bearing Restrictions: No      Mobility  Bed Mobility Overal bed mobility: Modified Independent             General bed mobility comments: Supine<>sit x2. Emphasis on back precautions and repetition to ensure proper technique.   Transfers Overall transfer level: Modified independent Equipment used: None             General transfer comment: Sit<>stand x3 for proper hand placement and technique to ensure proper back precautions were maintained. No physical assist required.   Ambulation/Gait Ambulation/Gait assistance: Modified independent (Device/Increase time);Supervision Ambulation Distance (Feet): 300 Feet Assistive device: None Gait Pattern/deviations: Step-through pattern;Decreased stride length;Narrow base of support Gait velocity: Decreased Gait velocity interpretation: Below normal speed for age/gender General Gait Details: VC's for general safety  awareness and maintaining back precautions. Pt reports his pain decreases after he "loosens up a little bit" during walk.   Stairs            Wheelchair Mobility    Modified Rankin (Stroke Patients Only)       Balance Overall balance assessment: No apparent balance deficits (not formally assessed)                                           Pertinent Vitals/Pain Pain Assessment: 0-10 Pain Score: 4  Pain Location: Lower back - surgical pain Pain Intervention(s): Monitored during session;Premedicated before session    [place] expects to be discharged to:: Private residence Living Arrangements: Spouse/significant other Available Help at Discharge: Family;Available 24 hours/day Type of Home: House Home Access: Stairs to enter   [employer] of Steps: 1 Home Layout: One level Home Equipment: Cane - single point;Shower seat - built in      Prior Function Level of Independence: Independent with assistive device(s)         Comments: used cane at times     Hand Dominance        Extremity/Trunk Assessment   Upper Extremity Assessment: Defer to OT evaluation;Overall WFL for tasks assessed           Lower Extremity Assessment: Overall WFL for tasks assessed      Cervical / Trunk Assessment: Normal  Communication   Communication: No difficulties  Cognition Arousal/Alertness: Awake/alert Behavior During Therapy: WFL for tasks assessed/performed Overall Cognitive Status: Within Functional Limits for tasks assessed  General Comments      Exercises        Assessment/Plan    PT Assessment Patent does not need any further PT services  PT Diagnosis     PT Problem List    PT Treatment Interventions     PT Goals (Current goals can be found in the Care Plan section) Acute Rehab PT Goals PT Goal Formulation: No goals set, d/c therapy    Frequency     Barriers to discharge         Co-evaluation               End of Session Equipment Utilized During Treatment: Back brace Activity Tolerance: Patient tolerated treatment well Patient left: in chair;with call bell/phone within reach Nurse Communication: Mobility status         Time: 3419-3790 PT Time Calculation (min): 21 min   Charges:   PT Evaluation $Initial PT Evaluation Tier I: 1 Procedure PT Treatments $Gait Training: 8-22 mins   PT G CodesJolyn Lent 11/24/2013, 11:09 AM  Jolyn Lent, PT, DPT Acute Rehabilitation Services Pager: [phone]

## 2013-11-24 NOTE — Discharge Instructions (Signed)
Wound Care Keep incision covered and dry for 3 DAYS.   You may remove outer bandage after 3 days   Do not put any creams, lotions, or ointments on incision. Leave steri-strips on back.  They will fall off by themselves. Activity Walk each and every day, increasing distance each day. No lifting greater than 5 lbs.  Avoid bending, arching, or twisting. No driving for 2 weeks; may ride as a passenger locally. If provided with back brace, wear when out of bed.  It is not necessary to wear in bed. Diet Resume your normal diet.  Return to Work Will be discussed at you follow up appointment. Call Your Doctor If Any of These Occur Redness, drainage, or swelling at the wound.  Temperature greater than 101 degrees. Severe pain not relieved by pain medication. Incision starts to come apart. Follow Up Appt Call today for appointment in 1-2 weeks ([phone]) or for problems.  If you have any hardware placed in your spine, you will need an x-ray before your appointment.

## 2013-11-24 NOTE — Progress Notes (Signed)
Occupational Therapy Evaluation Patient Details Name: LEHI PHIFER MRN: [id number] DOB: [birth year] Today's Date: 11/24/2013    History of Present Illness 63 yo s/p L4-5 MAS PLIF   Clinical Impression   Completed all education regarding back precautions for ADL and functional mobility. No DME needs. Pt safe to D/C home with intermittent S. OT signing off. Thanks    Follow Up Recommendations  No OT follow up;Supervision - Intermittent    Equipment Recommendations  None recommended by OT    Recommendations for Other Services       Precautions / Restrictions Precautions Precautions: Back Precaution Booklet Issued: Yes (comment) Required Braces or Orthoses: Spinal Brace Spinal Brace: Applied in sitting position;Lumbar corset      Mobility Bed Mobility Overal bed mobility: Modified Independent                Transfers Overall transfer level: Modified independent                    Balance Overall balance assessment: No apparent balance deficits (not formally assessed)                                          ADL Overall ADL's : Needs assistance/impaired     Grooming: Supervision/safety   Upper Body Bathing: Supervision/ safety;Sitting;Set up   Lower Body Bathing: Supervison/ safety;Set up;Sit to/from stand   Upper Body Dressing : Set up;Supervision/safety;Sitting   Lower Body Dressing: Supervision/safety;Set up;Sit to/from stand   Toilet Transfer: Ambulation;Supervision/safety   Toileting- Water quality scientist and Hygiene: Supervision/safety;Sit to/from stand       Functional mobility during ADLs: Modified independent General ADL Comments: Good carry over of use of back precautions from earlier PT session. Educated on back precautions for ADL. Pt vebalized understanding.     Vision                     Perception     Praxis      Pertinent Vitals/Pain Pain Assessment: No/denies pain     Hand Dominance      Extremity/Trunk Assessment Upper Extremity Assessment Upper Extremity Assessment: Overall WFL for tasks assessed   Lower Extremity Assessment Lower Extremity Assessment: Defer to PT evaluation   Cervical / Trunk Assessment Cervical / Trunk Assessment: Normal   Communication Communication Communication: No difficulties   Cognition Arousal/Alertness: Awake/alert Behavior During Therapy: WFL for tasks assessed/performed Overall Cognitive Status: Within Functional Limits for tasks assessed                     General Comments       Exercises       Shoulder Instructions      Home Living Family/patient expects to be discharged to:: Private residence Living Arrangements: Spouse/significant other Available Help at Discharge: Family;Available 24 hours/day Type of Home: House       Home Layout: One level     Bathroom Shower/Tub: Occupational psychologist: Standard Bathroom Accessibility: Yes How Accessible: Accessible via walker Home Equipment: Streeter - single point;Shower seat - built in          Prior Functioning/Environment Level of Independence: Independent with assistive device(s)        Comments: used cane at times    OT Diagnosis:     OT Problem List:     OT Treatment/Interventions:  OT Goals(Current goals can be found in the care plan section)    OT Frequency:     Barriers to D/C:            Co-evaluation              End of Session Equipment Utilized During Treatment: Back brace Nurse Communication: Mobility status  Activity Tolerance: Patient tolerated treatment well Patient left: in chair;with call bell/phone within reach   Time: 0947-1001 OT Time Calculation (min): 14 min Charges:  OT General Charges $OT Visit: 1 Procedure OT Evaluation $Initial OT Evaluation Tier I: 1 Procedure OT Treatments $Self Care/Home Management : 8-22 mins G-Codes:    ,HILLARY December 04, 2013, 10:10 AM   Maurie Boettcher, OTR/L   [phone] 04-Dec-2013

## 2013-11-24 NOTE — Transfer of Care (Signed)
Immediate Anesthesia Transfer of Care Note  Patient: Hunter Bridges  Procedure(s) Performed: Procedure(s): LUMBAR FOUR-FIVE FOR MAXIMUM ACCESS (MAS) POSTERIOR LUMBAR INTERBODY FUSION (PLIF)  (N/A)  Patient Location: PACU  Anesthesia Type:General  Level of Consciousness: awake  Airway & Oxygen Therapy: Patient Spontanous Breathing and Patient connected to nasal cannula oxygen  Post-op Assessment: Report given to PACU RN  Post vital signs: Reviewed  Complications: No apparent anesthesia complications

## 2013-11-25 ENCOUNTER — Encounter (HOSPITAL_COMMUNITY): Payer: Self-pay | Admitting: Neurological Surgery

## 2013-11-25 NOTE — Anesthesia Postprocedure Evaluation (Signed)
  Anesthesia Post-op Note  Patient: Hunter Bridges  Procedure(s) Performed: Procedure(s): LUMBAR FOUR-FIVE FOR MAXIMUM ACCESS (MAS) POSTERIOR LUMBAR INTERBODY FUSION (PLIF)  (N/A)  Patient Location: PACU  Anesthesia Type:General  Level of Consciousness: awake, alert , oriented and patient cooperative  Airway and Oxygen Therapy: Patient Spontanous Breathing  Post-op Pain: moderate  Post-op Assessment: Post-op Vital signs reviewed, Patient's Cardiovascular Status Stable, Respiratory Function Stable, Patent Airway, No signs of Nausea or vomiting and Pain level controlled  Post-op Vital Signs: stable  Last Vitals:  Filed Vitals:   11/24/13 1236  BP: 116/72  Pulse: 88  Temp: 37.1 C  Resp: 18    Complications: No apparent anesthesia complications

## 2013-12-12 NOTE — Addendum Note (Signed)
Addendum created 12/12/13 0920 by Kate Sable, MD   Modules edited: Anesthesia Events

## 2014-06-19 ENCOUNTER — Encounter (HOSPITAL_BASED_OUTPATIENT_CLINIC_OR_DEPARTMENT_OTHER): Payer: Self-pay | Admitting: *Deleted

## 2014-06-19 NOTE — Progress Notes (Signed)
Pt working out of town-will need istat-had lumbar surgery 9/15-ekg good

## 2014-06-20 ENCOUNTER — Ambulatory Visit: Payer: Self-pay | Admitting: Ophthalmology

## 2014-06-20 ENCOUNTER — Encounter: Payer: Self-pay | Admitting: Physical Therapy

## 2014-06-20 ENCOUNTER — Ambulatory Visit: Payer: BLUE CROSS/BLUE SHIELD | Attending: Student | Admitting: Physical Therapy

## 2014-06-20 DIAGNOSIS — M25561 Pain in right knee: Secondary | ICD-10-CM

## 2014-06-20 DIAGNOSIS — M25551 Pain in right hip: Secondary | ICD-10-CM | POA: Insufficient documentation

## 2014-06-20 DIAGNOSIS — M629 Disorder of muscle, unspecified: Secondary | ICD-10-CM | POA: Insufficient documentation

## 2014-06-20 DIAGNOSIS — M25562 Pain in left knee: Secondary | ICD-10-CM | POA: Diagnosis not present

## 2014-06-20 NOTE — Therapy (Signed)
[address] [place], [place], [address] Phone: [phone]   Fax:  [phone]  Physical Therapy Evaluation  Patient Details  Name: Hunter Bridges MRN: [id number] Date of Birth: 1950-10-07 Referring Provider:  Vela Prose, MD  Encounter Date: 06/20/2014      PT End of Session - 06/20/14 1726    Visit Number 1   Date for PT Re-Evaluation 08/20/14   PT Start Time 1659   PT Stop Time 1749   PT Time Calculation (min) 50 min   Activity Tolerance Patient tolerated treatment well      Past Medical History  Diagnosis Date  . Insomnia   . Allergy   . Anxiety   . Hyperlipidemia   . GERD (gastroesophageal reflux disease)   . Hiatal hernia   . Abdominal pain   . Arthritis   . Gallbladder disease   . Nausea alone   . Enlarged prostate   . Nocturia   . Diabetes mellitus without complication   . Pneumonia     hx  . Anemia     hx    Past Surgical History  Procedure Laterality Date  . Colonoscopy  2013  . Chest tube insertion  2006    DUE TO FX RIBS  . Transurethral resection of prostate  2006  . Cholecystectomy  01/12/2012    Procedure: LAPAROSCOPIC CHOLECYSTECTOMY;  Surgeon: Harl Bowie, MD;  Location: [facility];  Service: General;;  . Eye surgery  2012,15    strabismus,cataracts  . Cervical disc surgery  14  . Cardiac catheterization  05    nml  . Maximum access (mas)posterior lumbar interbody fusion (plif) 1 level N/A 11/23/2013    Procedure: LUMBAR FOUR-FIVE FOR MAXIMUM ACCESS (MAS) POSTERIOR LUMBAR INTERBODY FUSION (PLIF) ;  Surgeon: Eustace Moore, MD;  Location: [facility];  Service: Neurosurgery;  Laterality: N/A;    There were no vitals filed for this visit.  Visit Diagnosis:  Arthralgia of both knees - Plan: PT plan of care cert/re-cert  Right hip pain - Plan: PT plan of care cert/re-cert  Hamstring tightness of both lower extremities - Plan: PT plan of care  cert/re-cert      Subjective Assessment - 06/20/14 1659    Subjective Patient reports that he has had some knee pain for about two years, reports knees "inflamed"   Pertinent History lumbar fusion 11/2013   Limitations Walking;House hold activities;Lifting   How long can you sit comfortably? 1 hour   How long can you stand comfortably? 30 minutes   How long can you walk comfortably? 30 minutes   Diagnostic tests x-rays negative   Patient Stated Goals no pain, be able to squat   Currently in Pain? Yes   Pain Score 7    Pain Location Knee  also has some pain in the right hip   Pain Orientation Right;Left   Pain Descriptors / Indicators Aching   Pain Type Chronic pain   Pain Onset More than a month ago   Pain Frequency Constant   Aggravating Factors  squatting   Pain Relieving Factors cream, some stretches            [facility] PT Assessment - 06/20/14 0001    Assessment   Medical Diagnosis bilateral knee pain and right hip pain   Onset Date 06/19/13   Prior Therapy none   Precautions   Precautions None   Restrictions   Weight Bearing Restrictions  No   Balance Screen   Has the patient fallen in the past 6 months No   Has the patient had a decrease in activity level because of a fear of falling?  No   Is the patient reluctant to leave their home because of a fear of falling?  No   Home Environment   Additional Comments no stairs, does some yardwork   Prior Function   Level of Independence Independent with basic ADLs;Independent with homemaking with ambulation   Vocation Full time employment   Vocation Requirements sitting 50%, some lifting and squatting   Leisure walk dog   ROM / Strength   AROM / PROM / Strength --  AROM WNL but some pain, strength 4/5 with some pain   Flexibility   Soft Tissue Assessment /Muscle Length --  very tight HS and ITB, some tightness in piriformis   Palpation   Palpation some crepitus bilaterally, lateral tracking bilaterally, worse on the  right   Ambulation/Gait   Gait Comments slight toe out gait, no antalgia that is noted                   [facility] Adult PT Treatment/Exercise - 06/20/14 0001    Modalities   Modalities Moist Heat;Electrical Stimulation   Moist Heat Therapy   Number Minutes Moist Heat 15 Minutes   Moist Heat Location Other (comment)  right hip   Electrical Stimulation   Electrical Stimulation Location right hip   Electrical Stimulation Parameters IFC   Electrical Stimulation Goals Pain                PT Education - 06/20/14 1721    Education provided Yes   Education Details VMO strengthening and LE flexibility   Person(s) Educated Patient   Methods Explanation;Demonstration;Handout   Comprehension Verbalized understanding          PT Short Term Goals - 06/20/14 1728    PT SHORT TERM GOAL #1   Title independent with initial HEP   Time 2   Period Weeks   Status New           PT Long Term Goals - 06/20/14 1728    PT LONG TERM GOAL #1   Title decrease pain 50%   Time 8   Period Weeks   Status New   PT LONG TERM GOAL #2   Title increase flexibility of ITB   Time 8   Period Weeks   Status New   PT LONG TERM GOAL #3   Title squat without pain   Time 8   Period Weeks   Status New   PT LONG TERM GOAL #4   Title increase strength of knee to 4/5   Time 8   Period Weeks   Status New               Plan - 06/20/14 1727    Clinical Impression Statement Patient very tight HS, calves, piriformis and ITB, has lateral tracking patella, bilateral knee pain and right hip pain   Pt will benefit from skilled therapeutic intervention in order to improve on the following deficits Pain;Difficulty walking;Impaired flexibility   Rehab Potential Good   PT Frequency 2x / week   PT Duration 8 weeks   PT Treatment/Interventions Electrical Stimulation;Ultrasound;Moist Heat;Gait training;Functional mobility training;Balance training;Therapeutic exercise;Therapeutic  activities;Patient/family education   PT Next Visit Plan add gym exercises and focus on flexibility   Consulted and Agree with Plan of Care Patient  Problem List Patient Active Problem List   Diagnosis Date Noted  . S/P lumbar spinal fusion 11/23/2013  . Biliary dyskinesia 01/01/2012    Sumner Boast, PT 06/20/2014, 5:39 PM  [address] [place], [place], [address] Phone: [phone]   Fax:  [phone]

## 2014-06-20 NOTE — H&P (Signed)
  Date of examination:  06-12-14  Indication for surgery: to straighten the eyes and relieve double vision  Pertinent past medical history:  Past Medical History  Diagnosis Date  . Insomnia   . Allergy   . Anxiety   . Hyperlipidemia   . GERD (gastroesophageal reflux disease)   . Hiatal hernia   . Abdominal pain   . Arthritis   . Gallbladder disease   . Nausea alone   . Enlarged prostate   . Nocturia   . Diabetes mellitus without complication   . Pneumonia     hx  . Anemia     hx    Pertinent ocular history:  Diplopia x 15 years.  Myasthenia workup negative.  Tried prism.  S/p RMR recess with upshift '04.  Esotropia recurred, has required increasing amounts of prism, now opts for further surgery  Pertinent family history:  Family History  Problem Relation Age of Onset  . Cancer Mother     colon    General:  Healthy appearing patient in no distress.    Eyes:    Acuity cc OD 20/25  OS 20/25  External: Within normal limits     Anterior segment: Within normal limits x healed conj scars and PCIOL OU  Motility:  ET = 25 comitant. LHT = 4 by maddox rod.  5 deg excyclo by DMR  Fundus: deferred  Refraction:  Manifest  mild cyl OU   Heart: Regular rate and rhythm without murmur     Lungs: Clear to auscultation     Abdomen: Soft, nontender, normal bowel sounds     Impression:Esotropia, recurrent.  Neuroimaging and myasthenia workup normal.    Plan: LMR recess adjustable, partial tenotomy of LSR from nasal side for LHT/ excyclo  , O

## 2014-06-23 ENCOUNTER — Ambulatory Visit (HOSPITAL_BASED_OUTPATIENT_CLINIC_OR_DEPARTMENT_OTHER): Payer: BLUE CROSS/BLUE SHIELD | Admitting: Certified Registered"

## 2014-06-23 ENCOUNTER — Ambulatory Visit (HOSPITAL_BASED_OUTPATIENT_CLINIC_OR_DEPARTMENT_OTHER)
Admission: RE | Admit: 2014-06-23 | Discharge: 2014-06-23 | Disposition: A | Discharge status: Home / Self Care | Payer: BLUE CROSS/BLUE SHIELD | Source: Ambulatory Visit | Attending: Ophthalmology | Admitting: Ophthalmology

## 2014-06-23 ENCOUNTER — Encounter (HOSPITAL_BASED_OUTPATIENT_CLINIC_OR_DEPARTMENT_OTHER)
Admission: RE | Disposition: A | Discharge status: Home / Self Care | Payer: Self-pay | Source: Ambulatory Visit | Attending: Ophthalmology

## 2014-06-23 ENCOUNTER — Encounter (HOSPITAL_BASED_OUTPATIENT_CLINIC_OR_DEPARTMENT_OTHER): Payer: Self-pay | Admitting: Certified Registered"

## 2014-06-23 DIAGNOSIS — J449 Chronic obstructive pulmonary disease, unspecified: Secondary | ICD-10-CM | POA: Diagnosis not present

## 2014-06-23 DIAGNOSIS — E785 Hyperlipidemia, unspecified: Secondary | ICD-10-CM | POA: Diagnosis not present

## 2014-06-23 DIAGNOSIS — M1990 Unspecified osteoarthritis, unspecified site: Secondary | ICD-10-CM | POA: Insufficient documentation

## 2014-06-23 DIAGNOSIS — D649 Anemia, unspecified: Secondary | ICD-10-CM | POA: Insufficient documentation

## 2014-06-23 DIAGNOSIS — Z87891 Personal history of nicotine dependence: Secondary | ICD-10-CM | POA: Diagnosis not present

## 2014-06-23 DIAGNOSIS — H50412 Cyclotropia, left eye: Secondary | ICD-10-CM | POA: Diagnosis not present

## 2014-06-23 DIAGNOSIS — K449 Diaphragmatic hernia without obstruction or gangrene: Secondary | ICD-10-CM | POA: Diagnosis not present

## 2014-06-23 DIAGNOSIS — G4700 Insomnia, unspecified: Secondary | ICD-10-CM | POA: Diagnosis not present

## 2014-06-23 DIAGNOSIS — F419 Anxiety disorder, unspecified: Secondary | ICD-10-CM | POA: Diagnosis not present

## 2014-06-23 DIAGNOSIS — K219 Gastro-esophageal reflux disease without esophagitis: Secondary | ICD-10-CM | POA: Insufficient documentation

## 2014-06-23 DIAGNOSIS — H532 Diplopia: Secondary | ICD-10-CM | POA: Diagnosis not present

## 2014-06-23 DIAGNOSIS — E119 Type 2 diabetes mellitus without complications: Secondary | ICD-10-CM | POA: Insufficient documentation

## 2014-06-23 DIAGNOSIS — H5000 Unspecified esotropia: Secondary | ICD-10-CM | POA: Insufficient documentation

## 2014-06-23 HISTORY — PX: STRABISMUS SURGERY: SHX218

## 2014-06-23 LAB — POCT I-STAT, CHEM 8
BUN: 11 mg/dL (ref 6–23)
CHLORIDE: 98 mmol/L (ref 96–112)
CREATININE: 0.7 mg/dL (ref 0.50–1.35)
Calcium, Ion: 1.23 mmol/L (ref 1.13–1.30)
Glucose, Bld: 129 mg/dL — ABNORMAL HIGH (ref 70–99)
HCT: 42 % (ref 39.0–52.0)
HEMOGLOBIN: 14.3 g/dL (ref 13.0–17.0)
POTASSIUM: 4.2 mmol/L (ref 3.5–5.1)
SODIUM: 140 mmol/L (ref 135–145)
TCO2: 17 mmol/L (ref 0–100)

## 2014-06-23 LAB — GLUCOSE, CAPILLARY: GLUCOSE-CAPILLARY: 115 mg/dL — AB (ref 70–99)

## 2014-06-23 SURGERY — REPAIR STRABISMUS
Anesthesia: General | Site: Eye | Laterality: Left

## 2014-06-23 MED ORDER — FENTANYL CITRATE (PF) 100 MCG/2ML IJ SOLN
INTRAMUSCULAR | Status: AC
  Filled 2014-06-23: qty 2

## 2014-06-23 MED ORDER — OXYCODONE HCL 5 MG/5ML PO SOLN: 5.0000 mg | Freq: Once | ORAL | Status: AC | PRN

## 2014-06-23 MED ORDER — FENTANYL CITRATE (PF) 100 MCG/2ML IJ SOLN
25.0000 ug | INTRAMUSCULAR | Status: DC | PRN
  Administered 2014-06-23: 50 ug via INTRAVENOUS
  Administered 2014-06-23: 25 ug via INTRAVENOUS

## 2014-06-23 MED ORDER — LIDOCAINE HCL (CARDIAC) 20 MG/ML IV SOLN
INTRAVENOUS | Status: DC | PRN
  Administered 2014-06-23: 60 mg via INTRAVENOUS

## 2014-06-23 MED ORDER — LACTATED RINGERS IV SOLN
INTRAVENOUS | Status: DC
  Administered 2014-06-23 (×2): via INTRAVENOUS

## 2014-06-23 MED ORDER — ONDANSETRON HCL 4 MG/2ML IJ SOLN: 4.0000 mg | Freq: Four times a day (QID) | INTRAMUSCULAR | Status: DC | PRN

## 2014-06-23 MED ORDER — SUCCINYLCHOLINE CHLORIDE 20 MG/ML IJ SOLN
INTRAMUSCULAR | Status: AC
  Filled 2014-06-23: qty 1

## 2014-06-23 MED ORDER — FENTANYL CITRATE (PF) 100 MCG/2ML IJ SOLN
INTRAMUSCULAR | Status: AC
  Filled 2014-06-23: qty 6

## 2014-06-23 MED ORDER — DEXAMETHASONE SODIUM PHOSPHATE 4 MG/ML IJ SOLN
INTRAMUSCULAR | Status: DC | PRN
  Administered 2014-06-23: 4 mg via INTRAVENOUS

## 2014-06-23 MED ORDER — PROPOFOL 10 MG/ML IV BOLUS
INTRAVENOUS | Status: DC | PRN
  Administered 2014-06-23: 160 mg via INTRAVENOUS

## 2014-06-23 MED ORDER — BSS IO SOLN
INTRAOCULAR | Status: AC
  Filled 2014-06-23: qty 15

## 2014-06-23 MED ORDER — TOBRAMYCIN-DEXAMETHASONE 0.3-0.1 % OP OINT: 1.0000 "application " | TOPICAL_OINTMENT | Freq: Two times a day (BID) | OPHTHALMIC | Status: DC

## 2014-06-23 MED ORDER — MIDAZOLAM HCL 2 MG/2ML IJ SOLN
INTRAMUSCULAR | Status: AC
  Filled 2014-06-23: qty 2

## 2014-06-23 MED ORDER — MIDAZOLAM HCL 2 MG/2ML IJ SOLN
1.0000 mg | INTRAMUSCULAR | Status: DC | PRN
  Administered 2014-06-23: 1 mg via INTRAVENOUS

## 2014-06-23 MED ORDER — TOBRAMYCIN-DEXAMETHASONE 0.3-0.1 % OP SUSP
OPHTHALMIC | Status: AC
  Filled 2014-06-23: qty 2.5

## 2014-06-23 MED ORDER — ONDANSETRON HCL 4 MG/2ML IJ SOLN
INTRAMUSCULAR | Status: DC | PRN
  Administered 2014-06-23: 4 mg via INTRAVENOUS

## 2014-06-23 MED ORDER — GLYCOPYRROLATE 0.2 MG/ML IJ SOLN
INTRAMUSCULAR | Status: DC | PRN
  Administered 2014-06-23: 0.2 mg via INTRAVENOUS

## 2014-06-23 MED ORDER — TOBRAMYCIN-DEXAMETHASONE 0.3-0.1 % OP OINT
TOPICAL_OINTMENT | OPHTHALMIC | Status: DC | PRN
  Administered 2014-06-23: 1 via OPHTHALMIC

## 2014-06-23 MED ORDER — PROPOFOL 500 MG/50ML IV EMUL
INTRAVENOUS | Status: AC
  Filled 2014-06-23: qty 50

## 2014-06-23 MED ORDER — OXYCODONE HCL 5 MG PO TABS
ORAL_TABLET | ORAL | Status: AC
  Filled 2014-06-23: qty 1

## 2014-06-23 MED ORDER — OXYCODONE-ACETAMINOPHEN 7.5-325 MG PO TABS: 1.0000 | ORAL_TABLET | ORAL | Status: DC | PRN

## 2014-06-23 MED ORDER — OXYCODONE HCL 5 MG PO TABS
5.0000 mg | ORAL_TABLET | Freq: Once | ORAL | Status: AC | PRN
  Administered 2014-06-23: 5 mg via ORAL

## 2014-06-23 MED ORDER — BSS IO SOLN: INTRAOCULAR | Status: DC | PRN

## 2014-06-23 MED ORDER — FENTANYL CITRATE (PF) 100 MCG/2ML IJ SOLN
INTRAMUSCULAR | Status: DC | PRN
  Administered 2014-06-23 (×2): 50 ug via INTRAVENOUS

## 2014-06-23 MED ORDER — TOBRAMYCIN-DEXAMETHASONE 0.3-0.1 % OP OINT
TOPICAL_OINTMENT | OPHTHALMIC | Status: AC
  Filled 2014-06-23: qty 3.5

## 2014-06-23 SURGICAL SUPPLY — 30 items
APL SRG 3 HI ABS STRL LF PLS (MISCELLANEOUS) ×1
APPLICATOR COTTON TIP 6IN STRL (MISCELLANEOUS) ×8 IMPLANT
APPLICATOR DR MATTHEWS STRL (MISCELLANEOUS) ×2 IMPLANT
BANDAGE EYE OVAL (MISCELLANEOUS) ×2 IMPLANT
CAUTERY EYE LOW TEMP 1300F FIN (OPHTHALMIC RELATED) ×1 IMPLANT
COVER BACK TABLE 60X90IN (DRAPES) ×2 IMPLANT
COVER MAYO STAND STRL (DRAPES) ×2 IMPLANT
DRAPE SURG 17X23 STRL (DRAPES) ×4 IMPLANT
DRAPE U-SHAPE 76X120 STRL (DRAPES) ×1 IMPLANT
GLOVE BIO SURGEON STRL SZ 6.5 (GLOVE) ×2 IMPLANT
GLOVE BIOGEL M STRL SZ7.5 (GLOVE) ×4 IMPLANT
GOWN STRL REUS W/ TWL LRG LVL3 (GOWN DISPOSABLE) ×1 IMPLANT
GOWN STRL REUS W/TWL LRG LVL3 (GOWN DISPOSABLE) ×2
GOWN STRL REUS W/TWL XL LVL3 (GOWN DISPOSABLE) ×2 IMPLANT
NS IRRIG 1000ML POUR BTL (IV SOLUTION) ×2 IMPLANT
PACK BASIN DAY SURGERY FS (CUSTOM PROCEDURE TRAY) ×2 IMPLANT
SHEET MEDIUM DRAPE 40X70 STRL (DRAPES) IMPLANT
SLEEVE SCD COMPRESS KNEE MED (MISCELLANEOUS) ×2 IMPLANT
SPEAR EYE SURG WECK-CEL (MISCELLANEOUS) ×4 IMPLANT
STRIP CLOSURE SKIN 1/4X4 (GAUZE/BANDAGES/DRESSINGS) ×1 IMPLANT
SUT 6 0 SILK T G140 8DA (SUTURE) ×1 IMPLANT
SUT MERSILENE 6 0 S14 DA (SUTURE) IMPLANT
SUT PLAIN 6 0 TG1408 (SUTURE) ×1 IMPLANT
SUT SILK 4 0 C 3 735G (SUTURE) IMPLANT
SUT VICRYL 6 0 S 28 (SUTURE) ×1 IMPLANT
SUT VICRYL ABS 6-0 S29 18IN (SUTURE) IMPLANT
SYR TB 1ML LL NO SAFETY (SYRINGE) ×2 IMPLANT
SYRINGE 10CC LL (SYRINGE) ×2 IMPLANT
TOWEL OR 17X24 6PK STRL BLUE (TOWEL DISPOSABLE) ×2 IMPLANT
TRAY DSU PREP LF (CUSTOM PROCEDURE TRAY) ×2 IMPLANT

## 2014-06-23 NOTE — Discharge Instructions (Signed)
°  Keep patch on left eye till seen in Dr. Janee Morn office this afternoon  Diet: Clear liquids, advance to soft foods then regular diet as tolerated.  Pain control:   1)  Ibuprofen 600 mg by mouth every 6-8 hours as needed for pain  2)  Percocet 7.5/325 one or two by mouth as needed every 4-6 hours as needed  for pain that is not resolved by ibuprofen  Eye medications:  Tobradex eye ointment 1/2 inch in left eye twice a day for 1 week  Activity: No swimming for 1 week.  It is OK to let water run over the face and eyes while showering or taking a bath, even during the first week.  No other restriction on exercise or activity.  Call Dr. Janee Morn office [phone] with any problems or concerns.    Post Anesthesia Home Care Instructions  Activity: Get plenty of rest for the remainder of the day. A responsible adult should stay with you for 24 hours following the procedure.  For the next 24 hours, DO NOT: -Drive a car -Paediatric nurse -Drink alcoholic beverages -Take any medication unless instructed by your physician -Make any legal decisions or sign important papers.  Meals: Start with liquid foods such as gelatin or soup. Progress to regular foods as tolerated. Avoid greasy, spicy, heavy foods. If nausea and/or vomiting occur, drink only clear liquids until the nausea and/or vomiting subsides. Call your physician if vomiting continues.  Special Instructions/Symptoms: Your throat may feel dry or sore from the anesthesia or the breathing tube placed in your throat during surgery. If this causes discomfort, gargle with warm salt water. The discomfort should disappear within 24 hours.  If you had a scopolamine patch placed behind your ear for the management of post- operative nausea and/or vomiting:  1. The medication in the patch is effective for 72 hours, after which it should be removed.  Wrap patch in a tissue and discard in the trash. Wash hands thoroughly with soap and water. 2.  You may remove the patch earlier than 72 hours if you experience unpleasant side effects which may include dry mouth, dizziness or visual disturbances. 3. Avoid touching the patch. Wash your hands with soap and water after contact with the patch.

## 2014-06-23 NOTE — Interval H&P Note (Signed)
History and Physical Interval Note:  06/23/2014 7:59 AM  Hunter Bridges  has presented today for surgery, with the diagnosis of ESOTROPIA  The various methods of treatment have been discussed with the patient and family. After consideration of risks, benefits and other options for treatment, the patient has consented to  Procedure(s): REPAIR STRABISMUS LEFT EYE (Left) as a surgical intervention .  The patient's history has been reviewed, patient examined, no change in status, stable for surgery.  I have reviewed the patient's chart and labs.  Questions were answered to the patient's satisfaction.     Derry Skill

## 2014-06-23 NOTE — Anesthesia Postprocedure Evaluation (Signed)
Anesthesia Post Note  Patient: Hunter Bridges  Procedure(s) Performed: Procedure(s) (LRB): REPAIR STRABISMUS LEFT EYE (Left)  Anesthesia type: General  Patient location: PACU  Post pain: Pain level controlled and Adequate analgesia  Post assessment: Post-op Vital signs reviewed, Patient's Cardiovascular Status Stable, Respiratory Function Stable, Patent Airway and Pain level controlled  Last Vitals:  Filed Vitals:   06/23/14 1000  BP: 145/90  Pulse: 108  Temp:   Resp: 21    Post vital signs: Reviewed and stable  Level of consciousness: awake, alert  and oriented  Complications: No apparent anesthesia complications

## 2014-06-23 NOTE — Anesthesia Procedure Notes (Signed)
Procedure Name: LMA Insertion Date/Time: 06/23/2014 8:14 AM Performed by: Baxter Flattery Pre-anesthesia Checklist: Patient identified, Emergency Drugs available, Suction available and Patient being monitored Patient Re-evaluated:Patient Re-evaluated prior to inductionOxygen Delivery Method: Circle System Utilized Preoxygenation: Pre-oxygenation with 100% oxygen Intubation Type: IV induction Ventilation: Mask ventilation without difficulty LMA: LMA flexible inserted LMA Size: 4.0 Number of attempts: 2 Airway Equipment and Method: Bite block Placement Confirmation: positive ETCO2 and breath sounds checked- equal and bilateral Tube secured with: Tape Dental Injury: Teeth and Oropharynx as per pre-operative assessment

## 2014-06-23 NOTE — Op Note (Signed)
06/23/2014  9:24 AM  PATIENT:  Hunter Bridges    PRE-OPERATIVE DIAGNOSIS:  1. ESOTROPIA, recurrent     2.  Left hypertropia     3. Excyclotropia  POST-OPERATIVE DIAGNOSIS:  same  PROCEDURE:  1.  Left medial rectus muscle recession, 7.0 mm, adjustable   2. Partial tenotomy of left superior rectus muscle  SURGEON:  Derry Skill, MD  ANESTHESIA:   General  COMPLICATIONS: none  OPERATIVE PROCEDURE: After routine preoperative evaluation including informed consent, the patient was taken to the operating room where he was identified by me. Gen. Anesthesia was induced without difficulty after placement of appropriate monitors. The patient was prepped and draped in standard sterile fashion. A lid speculum was placed in the left eye.  Through a superonasal fornix incision through conjunctiva and Tenon's fascia, the left superior rectus muscle was engaged on a series of muscle hooks. The insertion was cleared of its fascial attachments.  The width that the insertion was measured and found to be 10.0 mm. A mark was made 6.5 mm temporal to the nasal end of the superior rectus insertion. The tendon was disinserted with Wescott scissors from its nasal end to the mark, effecting a 65% tenotomy of the left superior rectus muscle. Conjunctiva was closed with a single 6-0 plain gut suture.  Through an inferonasal fornix incision through conjunctiva and Tenon's fascia, the left medial rectus muscle was engaged on a series of muscle hooks and carefully cleared of its fascial attachments. The tendon was secured with a double-armed 6-0 Vicryl suture, with a locking bite at each border of the muscle, 1 mm from the insertion. The muscle was disinserted. The pole sutures were passed back into the muscle stump in crossed swords fashion. The muscle was drawn up to the level of the original insertion. The pole sutures were tied together approximately 10 cm above sclera. With the muscle still drawn up to the  original insertion, the pole sutures were then grasped with a needle driver at a measured distance of 7.0 mm above sclera. A noose knot was placed around the tube pole sutures at this location. The medial rectus muscle was allowed to hang back until the noose knot reached sclera, effecting a 7.0 mm hang back recession of the medial rectus muscle on an adjustable suture. A traction suture of 6-0 silk was placed at the nasal limbus. The conjunctival incision was closed loosely with a 6-0 plain gut suture, leaving the majority the incision open to facilitate suture adjustment. The pole, noose, traction, and conjunctival sutures were taped to the eyelids. Tobradex ointment was placed in the eye. A sterile pad was placed over the eye. The patient was awakened without difficulty and taken to the recovery room in stable condition, having suffered no intraoperative or immediate postoperative complications.  Derry Skill, MD

## 2014-06-23 NOTE — Transfer of Care (Signed)
Immediate Anesthesia Transfer of Care Note  Patient: Hunter Bridges  Procedure(s) Performed: Procedure(s): REPAIR STRABISMUS LEFT EYE (Left)  Patient Location: PACU  Anesthesia Type:General  Level of Consciousness: awake, alert  and oriented  Airway & Oxygen Therapy: Patient Spontanous Breathing and Patient connected to face mask oxygen  Post-op Assessment: Report given to RN, Post -op Vital signs reviewed and stable and Patient moving all extremities  Post vital signs: Reviewed and stable  Last Vitals:  Filed Vitals:   06/23/14 0926  BP:   Pulse: 114  Temp:   Resp: 13    Complications: No apparent anesthesia complications

## 2014-06-23 NOTE — H&P (View-Only) (Signed)
  Date of examination:  06-12-14  Indication for surgery: to straighten the eyes and relieve double vision  Pertinent past medical history:  Past Medical History  Diagnosis Date  . Insomnia   . Allergy   . Anxiety   . Hyperlipidemia   . GERD (gastroesophageal reflux disease)   . Hiatal hernia   . Abdominal pain   . Arthritis   . Gallbladder disease   . Nausea alone   . Enlarged prostate   . Nocturia   . Diabetes mellitus without complication   . Pneumonia     hx  . Anemia     hx    Pertinent ocular history:  Diplopia x 15 years.  Myasthenia workup negative.  Tried prism.  S/p RMR recess with upshift '04.  Esotropia recurred, has required increasing amounts of prism, now opts for further surgery  Pertinent family history:  Family History  Problem Relation Age of Onset  . Cancer Mother     colon    General:  Healthy appearing patient in no distress.    Eyes:    Acuity cc OD 20/25  OS 20/25  External: Within normal limits     Anterior segment: Within normal limits x healed conj scars and PCIOL OU  Motility:  ET = 25 comitant. LHT = 4 by maddox rod.  5 deg excyclo by DMR  Fundus: deferred  Refraction:  Manifest  mild cyl OU   Heart: Regular rate and rhythm without murmur     Lungs: Clear to auscultation     Abdomen: Soft, nontender, normal bowel sounds     Impression:Esotropia, recurrent.  Neuroimaging and myasthenia workup normal.    Plan: LMR recess adjustable, partial tenotomy of LSR from nasal side for LHT/ excyclo  , O

## 2014-06-23 NOTE — Anesthesia Preprocedure Evaluation (Signed)
Anesthesia Evaluation  Patient identified by MRN, date of birth, ID band Patient awake    Reviewed: Allergy & Precautions, NPO status , Patient's Chart, lab work & pertinent test results  Airway Mallampati: II   Neck ROM: full    Dental   Pulmonary former smoker,  breath sounds clear to auscultation        Cardiovascular negative cardio ROS  Rhythm:regular Rate:Normal     Neuro/Psych Anxiety    GI/Hepatic hiatal hernia, GERD-  ,  Endo/Other  diabetes, Type 2  Renal/GU      Musculoskeletal  (+) Arthritis -,   Abdominal   Peds  Hematology   Anesthesia Other Findings   Reproductive/Obstetrics                             Anesthesia Physical Anesthesia Plan  ASA: II  Anesthesia Plan: General   Post-op Pain Management:    Induction: Intravenous  Airway Management Planned: LMA  Additional Equipment:   Intra-op Plan:   Post-operative Plan:   Informed Consent: I have reviewed the patients History and Physical, chart, labs and discussed the procedure including the risks, benefits and alternatives for the proposed anesthesia with the patient or authorized representative who has indicated his/her understanding and acceptance.     Plan Discussed with: CRNA, Anesthesiologist and Surgeon  Anesthesia Plan Comments:         Anesthesia Quick Evaluation

## 2014-06-26 ENCOUNTER — Encounter (HOSPITAL_BASED_OUTPATIENT_CLINIC_OR_DEPARTMENT_OTHER): Payer: Self-pay | Admitting: Ophthalmology

## 2014-07-04 ENCOUNTER — Ambulatory Visit: Payer: BLUE CROSS/BLUE SHIELD | Admitting: Physical Therapy

## 2014-07-11 ENCOUNTER — Ambulatory Visit: Payer: BLUE CROSS/BLUE SHIELD | Attending: Student | Admitting: Physical Therapy

## 2014-07-11 ENCOUNTER — Encounter: Payer: Self-pay | Admitting: Physical Therapy

## 2014-07-11 DIAGNOSIS — M25562 Pain in left knee: Secondary | ICD-10-CM | POA: Insufficient documentation

## 2014-07-11 DIAGNOSIS — M629 Disorder of muscle, unspecified: Secondary | ICD-10-CM | POA: Insufficient documentation

## 2014-07-11 DIAGNOSIS — M25561 Pain in right knee: Secondary | ICD-10-CM | POA: Insufficient documentation

## 2014-07-11 DIAGNOSIS — M25551 Pain in right hip: Secondary | ICD-10-CM | POA: Insufficient documentation

## 2014-07-11 NOTE — Therapy (Signed)
[address] [place], [place], [address] Phone: [phone]   Fax:  [phone]  Physical Therapy Treatment  Patient Details  Name: Hunter Bridges MRN: [id number] Date of Birth: [birth year] Referring Provider:  Vela Prose, MD  Encounter Date: 07/11/2014      PT End of Session - 07/11/14 1730    Visit Number 2   PT Start Time 3154   PT Stop Time 0086   PT Time Calculation (min) 46 min      Past Medical History  Diagnosis Date  . Insomnia   . Allergy   . Anxiety   . Hyperlipidemia   . GERD (gastroesophageal reflux disease)   . Hiatal hernia   . Abdominal pain   . Arthritis   . Gallbladder disease   . Nausea alone   . Enlarged prostate   . Nocturia   . Diabetes mellitus without complication   . Pneumonia     hx  . Anemia     hx    Past Surgical History  Procedure Laterality Date  . Colonoscopy  2013  . Chest tube insertion  2006    DUE TO FX RIBS  . Transurethral resection of prostate  2006  . Cholecystectomy  01/12/2012    Procedure: LAPAROSCOPIC CHOLECYSTECTOMY;  Surgeon: Harl Bowie, MD;  Location: [facility];  Service: General;;  . Eye surgery  2012,15    strabismus,cataracts  . Cervical disc surgery  14  . Cardiac catheterization  05    nml  . Maximum access (mas)posterior lumbar interbody fusion (plif) 1 level N/A 11/23/2013    Procedure: LUMBAR FOUR-FIVE FOR MAXIMUM ACCESS (MAS) POSTERIOR LUMBAR INTERBODY FUSION (PLIF) ;  Surgeon: Eustace Moore, MD;  Location: [facility];  Service: Neurosurgery;  Laterality: N/A;  . Strabismus surgery Left 06/23/2014    Procedure: REPAIR STRABISMUS LEFT EYE;  Surgeon: Everitt Amber, MD;  Location: [facility];  Service: Ophthalmology;  Laterality: Left;    There were no vitals filed for this visit.  Visit Diagnosis:  Arthralgia of both knees  Right hip pain  Hamstring tightness of both lower extremities       Subjective Assessment - 07/11/14 1704    Subjective Patient reports that he had a death in the family, also had eye surgery, so he has not been in to therapy in 3 weeks   Currently in Pain? Yes   Pain Score 3    Pain Location Hip   Pain Orientation Right   Pain Descriptors / Indicators Aching   Pain Type Chronic pain   Aggravating Factors  standing   Pain Relieving Factors rest                         [facility] Adult PT Treatment/Exercise - 07/11/14 0001    Knee/Hip Exercises: Stretches   Passive Hamstring Stretch 30 seconds;4 reps   ITB Stretch 30 seconds;4 reps   Piriformis Stretch 30 seconds;4 reps   Knee/Hip Exercises: Aerobic   Isokinetic NuStep level 4 x 5 minutes   Knee/Hip Exercises: Sidelying   Clams 15   Modalities   Modalities Moist Heat;Electrical Stimulation   Moist Heat Therapy   Number Minutes Moist Heat 15 Minutes   Moist Heat Location Other (comment)   Electrical Stimulation   Electrical Stimulation Location right hip   Electrical Stimulation Parameters IFC   Electrical Stimulation Goals Pain   Knee/Hip Exercises:  Machines for Strengthening   Cybex Knee Extension 5# 2x 15   Cybex Knee Flexion 25# 2x 15                  PT Short Term Goals - 07/11/14 1731    PT SHORT TERM GOAL #1   Title independent with initial HEP   Status Achieved           PT Long Term Goals - 06/20/14 1728    PT LONG TERM GOAL #1   Title decrease pain 50%   Time 8   Period Weeks   Status New   PT LONG TERM GOAL #2   Title increase flexibility of ITB   Time 8   Period Weeks   Status New   PT LONG TERM GOAL #3   Title squat without pain   Time 8   Period Weeks   Status New   PT LONG TERM GOAL #4   Title increase strength of knee to 4/5   Time 8   Period Weeks   Status New               Plan - 07/11/14 1731    Clinical Impression Statement tight piriformis and ITB, reports that the stretches helped   PT Next Visit Plan add gym  exercises and focus on flexibility   Consulted and Agree with Plan of Care Patient        Problem List Patient Active Problem List   Diagnosis Date Noted  . S/P lumbar spinal fusion 11/23/2013  . Biliary dyskinesia 01/01/2012    Sumner Boast, PT 07/11/2014, 5:32 PM  [address] [place], [place], [address] Phone: [phone]   Fax:  [phone]

## 2014-07-13 ENCOUNTER — Encounter: Payer: Self-pay | Admitting: Physical Therapy

## 2014-07-13 ENCOUNTER — Ambulatory Visit: Payer: BLUE CROSS/BLUE SHIELD | Admitting: Physical Therapy

## 2014-07-13 DIAGNOSIS — M25561 Pain in right knee: Secondary | ICD-10-CM | POA: Diagnosis not present

## 2014-07-13 DIAGNOSIS — M25551 Pain in right hip: Secondary | ICD-10-CM

## 2014-07-13 DIAGNOSIS — M25562 Pain in left knee: Principal | ICD-10-CM

## 2014-07-13 DIAGNOSIS — M629 Disorder of muscle, unspecified: Secondary | ICD-10-CM

## 2014-07-13 NOTE — Therapy (Signed)
[place], [place], [address] Phone: [phone]   Fax:  [phone]  Physical Therapy Treatment  Patient Details  Name: Hunter Bridges MRN: [id number] Date of Birth: Sep 30, 1950 Referring Provider:  Vela Prose, MD  Encounter Date: 07/13/2014      PT End of Session - 07/13/14 1743    Visit Number 3   Date for PT Re-Evaluation 08/20/14   PT Start Time 1700   PT Stop Time 0867   PT Time Calculation (min) 48 min      Past Medical History  Diagnosis Date  . Insomnia   . Allergy   . Anxiety   . Hyperlipidemia   . GERD (gastroesophageal reflux disease)   . Hiatal hernia   . Abdominal pain   . Arthritis   . Gallbladder disease   . Nausea alone   . Enlarged prostate   . Nocturia   . Diabetes mellitus without complication   . Pneumonia     hx  . Anemia     hx    Past Surgical History  Procedure Laterality Date  . Colonoscopy  2013  . Chest tube insertion  2006    DUE TO FX RIBS  . Transurethral resection of prostate  2006  . Cholecystectomy  01/12/2012    Procedure: LAPAROSCOPIC CHOLECYSTECTOMY;  Surgeon: Harl Bowie, MD;  Location: [facility];  Service: General;;  . Eye surgery  2012,15    strabismus,cataracts  . Cervical disc surgery  14  . Cardiac catheterization  05    nml  . Maximum access (mas)posterior lumbar interbody fusion (plif) 1 level N/A 11/23/2013    Procedure: LUMBAR FOUR-FIVE FOR MAXIMUM ACCESS (MAS) POSTERIOR LUMBAR INTERBODY FUSION (PLIF) ;  Surgeon: Eustace Moore, MD;  Location: [facility];  Service: Neurosurgery;  Laterality: N/A;  . Strabismus surgery Left 06/23/2014    Procedure: REPAIR STRABISMUS LEFT EYE;  Surgeon: Everitt Amber, MD;  Location: [place];  Service: Ophthalmology;  Laterality: Left;    There were no vitals filed for this visit.  Visit Diagnosis:  Arthralgia of both knees  Right hip pain  Hamstring  tightness of both lower extremities      Subjective Assessment - 07/13/14 1711    Subjective I was a little sore in the mms afte rthe last treatment   Currently in Pain? Yes   Pain Score 2    Pain Location Hip   Pain Orientation Right   Pain Descriptors / Indicators Aching   Pain Type Chronic pain                         [facility] Adult PT Treatment/Exercise - 07/13/14 0001    Knee/Hip Exercises: Stretches   Passive Hamstring Stretch 30 seconds;4 reps   ITB Stretch 30 seconds;4 reps   Piriformis Stretch 30 seconds;4 reps   Knee/Hip Exercises: Aerobic   Isokinetic NuStep level 4 x 5 minutes   Knee/Hip Exercises: Machines for Strengthening   Cybex Knee Extension 5# 2x 15   Cybex Knee Flexion 25# 2x 15   Cybex Leg Press 20# 2x15   Hip Cybex 5# hip abduction and extension   Knee/Hip Exercises: Sidelying   Other Sidelying Knee Exercises supine ball bridging and isometric ball squeeze   Moist Heat Therapy   Number Minutes Moist Heat 15 Minutes   Moist Heat Location Other (comment)   Electrical Stimulation  Electrical Stimulation Location right hip   Electrical Stimulation Parameters IFC   Electrical Stimulation Goals Pain                  PT Short Term Goals - 07/11/14 1731    PT SHORT TERM GOAL #1   Title independent with initial HEP   Status Achieved           PT Long Term Goals - 06/20/14 1728    PT LONG TERM GOAL #1   Title decrease pain 50%   Time 8   Period Weeks   Status New   PT LONG TERM GOAL #2   Title increase flexibility of ITB   Time 8   Period Weeks   Status New   PT LONG TERM GOAL #3   Title squat without pain   Time 8   Period Weeks   Status New   PT LONG TERM GOAL #4   Title increase strength of knee to 4/5   Time 8   Period Weeks   Status New               Plan - 07/13/14 1748    Clinical Impression Statement weakness noted during exercises, some core weakness   PT Next Visit Plan add gym exercises  and focus on flexibility   Consulted and Agree with Plan of Care Patient        Problem List Patient Active Problem List   Diagnosis Date Noted  . S/P lumbar spinal fusion 11/23/2013  . Biliary dyskinesia 01/01/2012    Sumner Boast., PT 07/13/2014, 5:49 PM  [address] [place], [place], [address] Phone: [phone]   Fax:  [phone]

## 2014-07-19 ENCOUNTER — Ambulatory Visit: Payer: BLUE CROSS/BLUE SHIELD | Admitting: Physical Therapy

## 2014-07-19 ENCOUNTER — Encounter: Payer: Self-pay | Admitting: Physical Therapy

## 2014-07-19 DIAGNOSIS — M629 Disorder of muscle, unspecified: Secondary | ICD-10-CM

## 2014-07-19 DIAGNOSIS — M25561 Pain in right knee: Secondary | ICD-10-CM | POA: Diagnosis not present

## 2014-07-19 DIAGNOSIS — M25551 Pain in right hip: Secondary | ICD-10-CM

## 2014-07-19 DIAGNOSIS — M25562 Pain in left knee: Principal | ICD-10-CM

## 2014-07-19 NOTE — Therapy (Signed)
[place], [place], [address] Phone: [phone]   Fax:  [phone]  Physical Therapy Treatment  Patient Details  Name: Hunter Bridges MRN: [id number] Date of Birth: Dec 08, 1950 Referring Provider:  Vela Prose, MD  Encounter Date: 07/19/2014      PT End of Session - 07/19/14 1729    Visit Number 4   PT Start Time 1700   PT Stop Time 5364   PT Time Calculation (min) 47 min      Past Medical History  Diagnosis Date  . Insomnia   . Allergy   . Anxiety   . Hyperlipidemia   . GERD (gastroesophageal reflux disease)   . Hiatal hernia   . Abdominal pain   . Arthritis   . Gallbladder disease   . Nausea alone   . Enlarged prostate   . Nocturia   . Diabetes mellitus without complication   . Pneumonia     hx  . Anemia     hx    Past Surgical History  Procedure Laterality Date  . Colonoscopy  2013  . Chest tube insertion  2006    DUE TO FX RIBS  . Transurethral resection of prostate  2006  . Cholecystectomy  01/12/2012    Procedure: LAPAROSCOPIC CHOLECYSTECTOMY;  Surgeon: Harl Bowie, MD;  Location: [facility];  Service: General;;  . Eye surgery  2012,15    strabismus,cataracts  . Cervical disc surgery  14  . Cardiac catheterization  05    nml  . Maximum access (mas)posterior lumbar interbody fusion (plif) 1 level N/A 11/23/2013    Procedure: LUMBAR FOUR-FIVE FOR MAXIMUM ACCESS (MAS) POSTERIOR LUMBAR INTERBODY FUSION (PLIF) ;  Surgeon: Eustace Moore, MD;  Location: [facility];  Service: Neurosurgery;  Laterality: N/A;  . Strabismus surgery Left 06/23/2014    Procedure: REPAIR STRABISMUS LEFT EYE;  Surgeon: Everitt Amber, MD;  Location: [place];  Service: Ophthalmology;  Laterality: Left;    There were no vitals filed for this visit.  Visit Diagnosis:  Arthralgia of both knees  Right hip pain  Hamstring tightness of both lower extremities       Subjective Assessment - 07/19/14 1701    Subjective I am having less pain since the last visit   Currently in Pain? Yes   Pain Score 2    Pain Location Hip   Pain Orientation Right   Pain Descriptors / Indicators Aching   Pain Onset More than a month ago   Pain Frequency Constant   Aggravating Factors  standing and walking   Pain Relieving Factors the treatment                         [facility] Adult PT Treatment/Exercise - 07/19/14 0001    Knee/Hip Exercises: Stretches   Passive Hamstring Stretch 30 seconds;4 reps   ITB Stretch 30 seconds;4 reps   Piriformis Stretch 30 seconds;4 reps   Knee/Hip Exercises: Aerobic   Isokinetic NuStep level 5 x 5 minutes   Knee/Hip Exercises: Machines for Strengthening   Cybex Knee Extension 5# 2x 15   Cybex Knee Flexion 25# 2x 15   Cybex Leg Press 20# 2x15   Hip Cybex 5# hip abduction and extension, 10# obliques, 20# seated row and lats   Knee/Hip Exercises: Sidelying   Other Sidelying Knee Exercises supine ball bridging and isometric ball squeeze   Moist Heat Therapy  Number Minutes Moist Heat 15 Minutes   Moist Heat Location Other (comment)   Electrical Stimulation   Electrical Stimulation Location right hip   Electrical Stimulation Parameters IFC   Electrical Stimulation Goals Pain                  PT Short Term Goals - 07/11/14 1731    PT SHORT TERM GOAL #1   Title independent with initial HEP   Status Achieved           PT Long Term Goals - 07/19/14 1730    PT LONG TERM GOAL #1   Title decrease pain 50%   Status On-going   PT LONG TERM GOAL #2   Title increase flexibility of ITB   Status On-going               Plan - 07/19/14 1730    Clinical Impression Statement Doing better with exercises, tight LE mms, and weak core   PT Next Visit Plan add gym exercises and focus on flexibility   Consulted and Agree with Plan of Care Patient        Problem List Patient Active Problem List    Diagnosis Date Noted  . S/P lumbar spinal fusion 11/23/2013  . Biliary dyskinesia 01/01/2012    Sumner Boast., PT 07/19/2014, 5:31 PM  [address] [place], [place], [address] Phone: [phone]   Fax:  [phone]

## 2014-07-27 ENCOUNTER — Ambulatory Visit: Payer: BLUE CROSS/BLUE SHIELD | Admitting: Physical Therapy

## 2014-07-27 DIAGNOSIS — M25551 Pain in right hip: Secondary | ICD-10-CM

## 2014-07-27 DIAGNOSIS — M25561 Pain in right knee: Secondary | ICD-10-CM | POA: Diagnosis not present

## 2014-07-27 NOTE — Therapy (Addendum)
[place], [place], [address] Phone: [phone]   Fax:  [phone]  Physical Therapy Treatment  Patient Details  Name: Hunter Bridges MRN: [id number] Date of Birth: 1950/08/14 Referring Provider:  Vela Prose, MD  Encounter Date: 07/27/2014      PT End of Session - 07/27/14 1739    Visit Number 5   Date for PT Re-Evaluation 08/20/14   PT Start Time 1657   PT Stop Time 1740   PT Time Calculation (min) 43 min   Activity Tolerance Patient tolerated treatment well      Past Medical History  Diagnosis Date  . Insomnia   . Allergy   . Anxiety   . Hyperlipidemia   . GERD (gastroesophageal reflux disease)   . Hiatal hernia   . Abdominal pain   . Arthritis   . Gallbladder disease   . Nausea alone   . Enlarged prostate   . Nocturia   . Diabetes mellitus without complication   . Pneumonia     hx  . Anemia     hx    Past Surgical History  Procedure Laterality Date  . Colonoscopy  2013  . Chest tube insertion  2006    DUE TO FX RIBS  . Transurethral resection of prostate  2006  . Cholecystectomy  01/12/2012    Procedure: LAPAROSCOPIC CHOLECYSTECTOMY;  Surgeon: Harl Bowie, MD;  Location: [facility];  Service: General;;  . Eye surgery  2012,15    strabismus,cataracts  . Cervical disc surgery  14  . Cardiac catheterization  05    nml  . Maximum access (mas)posterior lumbar interbody fusion (plif) 1 level N/A 11/23/2013    Procedure: LUMBAR FOUR-FIVE FOR MAXIMUM ACCESS (MAS) POSTERIOR LUMBAR INTERBODY FUSION (PLIF) ;  Surgeon: Eustace Moore, MD;  Location: [facility];  Service: Neurosurgery;  Laterality: N/A;  . Strabismus surgery Left 06/23/2014    Procedure: REPAIR STRABISMUS LEFT EYE;  Surgeon: Everitt Amber, MD;  Location: [place];  Service: Ophthalmology;  Laterality: Left;    There were no vitals filed for this visit.  Visit Diagnosis:  Right hip  pain      Subjective Assessment - 07/27/14 1700    Subjective doing better. The stretches for HEP is helping.   Pertinent History lumbar fusion 11/2013   Limitations Walking;House hold activities;Lifting   How long can you sit comfortably? dong well no pian   How long can you stand comfortably? 30 min   How long can you walk comfortably? 30 min   Diagnostic tests x-rays negative   Patient Stated Goals no pain, be able to squat   Currently in Pain? Yes   Pain Score 4    Pain Location Hip   Pain Orientation Right   Pain Descriptors / Indicators Aching   Pain Onset More than a month ago                         [facility] Adult PT Treatment/Exercise - 07/27/14 0001    Ambulation/Gait   Ambulation/Gait Yes   Ambulation/Gait Assistance 7: Independent   Gait Pattern Within Functional Limits  focus hip rotation with arm swings, decrease foot out turn   Exercises   Exercises Knee/Hip   Knee/Hip Exercises: Stretches   Active Hamstring Stretch 30 seconds  bil   Passive Hamstring Stretch 30 seconds   Hip Flexor Stretch 30 seconds  bil   ITB Stretch 30 seconds   Piriformis Stretch 30 seconds   Knee/Hip Exercises: Aerobic   Elliptical NuStep level 5X6'   Knee/Hip Exercises: Sidelying   Other Sidelying Knee Exercises standing ER with 3# hand wts with static R cross with L then switch   Other Sidelying Knee Exercises standing lunges with arm reafch 3# hand wts   Manual Therapy   Manual Therapy Soft tissue mobilization  ITB and Piriformis and Psoas                  PT Short Term Goals - 07/11/14 1731    PT SHORT TERM GOAL #1   Title independent with initial HEP   Status Achieved           PT Long Term Goals - 07/27/14 1741    PT LONG TERM GOAL #1   Title decrease pain 50%   Period Weeks   Status On-going   PT LONG TERM GOAL #2   Title increase flexibility of ITB   Time 8   Period Weeks   Status On-going   PT LONG TERM GOAL #3   Title squat  without pain   Time 8   Period Weeks   Status Partially Met   PT LONG TERM GOAL #4   Title increase strength of knee to 4/5   Time 8   Period Weeks               Plan - 07/27/14 1740    Clinical Impression Statement patient decreased pain from 4/10 to 2/10 with ther ex and stretches in clinic today   Pt will benefit from skilled therapeutic intervention in order to improve on the following deficits Pain;Difficulty walking;Impaired flexibility   Rehab Potential Good   PT Frequency 2x / week   PT Treatment/Interventions Electrical Stimulation;Ultrasound;Moist Heat;Gait training;Functional mobility training;Balance training;Therapeutic exercise;Therapeutic activities;Patient/family education   PT Next Visit Plan gym exercises for strength and cont with flexibility       PHYSICAL THERAPY DISCHARGE SUMMARY  Visits from Start of Care: 5   Plan: Patient agrees to discharge.  Patient goals were partially met. Patient is being discharged due to not returning since the last visit.  ?????       Problem List Patient Active Problem List   Diagnosis Date Noted  . S/P lumbar spinal fusion 11/23/2013  . Biliary dyskinesia 01/01/2012     Natividad Brood, PTA  07/27/2014, 5:45 PM  [place] [place], [place], [address] Phone: [phone]   Fax:  [phone]

## 2014-08-03 ENCOUNTER — Ambulatory Visit: Payer: BLUE CROSS/BLUE SHIELD | Admitting: Physical Therapy

## 2014-08-28 ENCOUNTER — Other Ambulatory Visit: Payer: Self-pay

## 2014-10-17 ENCOUNTER — Ambulatory Visit: Payer: Self-pay | Admitting: Ophthalmology

## 2014-10-17 NOTE — H&P (Signed)
  Date of exination:  10-10-14  Indication for surgery: to straighten the eyes and relieve diplopia  Pertinent past medical history:  Past Medical History  Diagnosis Date  . Insomnia   . Allergy   . Anxiety   . Hyperlipidemia   . GERD (gastroesophageal reflux disease)   . Hiatal hernia   . Abdominal pain   . Arthritis   . Gallbladder disease   . Nausea alone   . Enlarged prostate   . Nocturia   . Diabetes mellitus without complication   . Pneumonia     hx  . Anemia     hx    Pertinent ocular history:  Binocular diplopia x 15 years.  Recurrent s/p strabismus surgery x2, currently using vertical and horizontal prism.  Myasthenia bloodwork normal.  DM. Pertinent family history:  Family History  Problem Relation Age of Onset  . Cancer Mother     colon    General:  Healthy appearing patient in no distress.    Eyes:    Acuity  cc OD 20/25  OS 20/20  External: Within normal limits     Anterior segment: Within normal limits x PCIOL OU  Motility:   ET=16, LHT=3.  2-3- elevation OU.  Horizontal ductions normal  Fundus: deferred  Refraction:   Mild cyl approx plano sph equiv OU   Heart: Regular rate and rhythm without murmur     Lungs: Clear to auscultation     Abdomen: Soft, nontender, normal bowel sounds     Impression:Esotropia, recurrent, with left hypertropia  Plan: RMR re-recess adjustable, RIR recess vs. Partial tenotomy  Derry Skill

## 2014-10-18 ENCOUNTER — Encounter (HOSPITAL_BASED_OUTPATIENT_CLINIC_OR_DEPARTMENT_OTHER): Payer: Self-pay | Admitting: *Deleted

## 2014-10-19 ENCOUNTER — Encounter (HOSPITAL_BASED_OUTPATIENT_CLINIC_OR_DEPARTMENT_OTHER): Payer: Self-pay | Admitting: *Deleted

## 2014-10-20 ENCOUNTER — Ambulatory Visit (HOSPITAL_BASED_OUTPATIENT_CLINIC_OR_DEPARTMENT_OTHER): Payer: BLUE CROSS/BLUE SHIELD | Admitting: Certified Registered"

## 2014-10-20 ENCOUNTER — Encounter (HOSPITAL_BASED_OUTPATIENT_CLINIC_OR_DEPARTMENT_OTHER)
Admission: RE | Disposition: A | Discharge status: Home / Self Care | Payer: Self-pay | Source: Ambulatory Visit | Attending: Ophthalmology

## 2014-10-20 ENCOUNTER — Encounter (HOSPITAL_BASED_OUTPATIENT_CLINIC_OR_DEPARTMENT_OTHER): Payer: Self-pay | Admitting: Certified Registered"

## 2014-10-20 ENCOUNTER — Ambulatory Visit (HOSPITAL_BASED_OUTPATIENT_CLINIC_OR_DEPARTMENT_OTHER)
Admission: RE | Admit: 2014-10-20 | Discharge: 2014-10-20 | Disposition: A | Discharge status: Home / Self Care | Payer: BLUE CROSS/BLUE SHIELD | Source: Ambulatory Visit | Attending: Ophthalmology | Admitting: Ophthalmology

## 2014-10-20 DIAGNOSIS — R351 Nocturia: Secondary | ICD-10-CM | POA: Diagnosis not present

## 2014-10-20 DIAGNOSIS — M1990 Unspecified osteoarthritis, unspecified site: Secondary | ICD-10-CM | POA: Insufficient documentation

## 2014-10-20 DIAGNOSIS — F419 Anxiety disorder, unspecified: Secondary | ICD-10-CM | POA: Insufficient documentation

## 2014-10-20 DIAGNOSIS — H532 Diplopia: Secondary | ICD-10-CM | POA: Diagnosis not present

## 2014-10-20 DIAGNOSIS — E785 Hyperlipidemia, unspecified: Secondary | ICD-10-CM | POA: Insufficient documentation

## 2014-10-20 DIAGNOSIS — K219 Gastro-esophageal reflux disease without esophagitis: Secondary | ICD-10-CM | POA: Insufficient documentation

## 2014-10-20 DIAGNOSIS — E119 Type 2 diabetes mellitus without complications: Secondary | ICD-10-CM | POA: Diagnosis not present

## 2014-10-20 DIAGNOSIS — H5000 Unspecified esotropia: Secondary | ICD-10-CM | POA: Diagnosis not present

## 2014-10-20 DIAGNOSIS — H5021 Vertical strabismus, right eye: Secondary | ICD-10-CM | POA: Diagnosis present

## 2014-10-20 DIAGNOSIS — G4700 Insomnia, unspecified: Secondary | ICD-10-CM | POA: Insufficient documentation

## 2014-10-20 DIAGNOSIS — N401 Enlarged prostate with lower urinary tract symptoms: Secondary | ICD-10-CM | POA: Diagnosis not present

## 2014-10-20 DIAGNOSIS — Z87891 Personal history of nicotine dependence: Secondary | ICD-10-CM | POA: Insufficient documentation

## 2014-10-20 DIAGNOSIS — K449 Diaphragmatic hernia without obstruction or gangrene: Secondary | ICD-10-CM | POA: Diagnosis not present

## 2014-10-20 HISTORY — PX: STRABISMUS SURGERY: SHX218

## 2014-10-20 LAB — POCT I-STAT, CHEM 8
BUN: 9 mg/dL (ref 6–20)
CALCIUM ION: 1.27 mmol/L (ref 1.13–1.30)
CREATININE: 0.8 mg/dL (ref 0.61–1.24)
Chloride: 99 mmol/L — ABNORMAL LOW (ref 101–111)
GLUCOSE: 124 mg/dL — AB (ref 65–99)
HCT: 44 % (ref 39.0–52.0)
HEMOGLOBIN: 15 g/dL (ref 13.0–17.0)
Potassium: 4.5 mmol/L (ref 3.5–5.1)
Sodium: 137 mmol/L (ref 135–145)
TCO2: 26 mmol/L (ref 0–100)

## 2014-10-20 LAB — GLUCOSE, CAPILLARY: GLUCOSE-CAPILLARY: 118 mg/dL — AB (ref 65–99)

## 2014-10-20 SURGERY — REPAIR STRABISMUS
Anesthesia: General | Site: Eye | Laterality: Right

## 2014-10-20 MED ORDER — HYDROMORPHONE HCL 1 MG/ML IJ SOLN: 0.2500 mg | INTRAMUSCULAR | Status: DC | PRN

## 2014-10-20 MED ORDER — OXYCODONE-ACETAMINOPHEN 5-325 MG PO TABS
ORAL_TABLET | ORAL | Status: AC
  Filled 2014-10-20: qty 1

## 2014-10-20 MED ORDER — MIDAZOLAM HCL 2 MG/2ML IJ SOLN
1.0000 mg | INTRAMUSCULAR | Status: DC | PRN
  Administered 2014-10-20: 1 mg via INTRAVENOUS

## 2014-10-20 MED ORDER — BSS IO SOLN
INTRAOCULAR | Status: DC | PRN
  Administered 2014-10-20: 10 mL via INTRAOCULAR

## 2014-10-20 MED ORDER — PROPOFOL 10 MG/ML IV BOLUS
INTRAVENOUS | Status: DC | PRN
  Administered 2014-10-20: 150 mg via INTRAVENOUS

## 2014-10-20 MED ORDER — FENTANYL CITRATE (PF) 100 MCG/2ML IJ SOLN
INTRAMUSCULAR | Status: AC
  Filled 2014-10-20: qty 6

## 2014-10-20 MED ORDER — OXYCODONE HCL 5 MG PO TABS
5.0000 mg | ORAL_TABLET | Freq: Once | ORAL | Status: AC
  Administered 2014-10-20: 5 mg via ORAL

## 2014-10-20 MED ORDER — MIDAZOLAM HCL 2 MG/2ML IJ SOLN
INTRAMUSCULAR | Status: AC
  Filled 2014-10-20: qty 2

## 2014-10-20 MED ORDER — DEXAMETHASONE SODIUM PHOSPHATE 4 MG/ML IJ SOLN
INTRAMUSCULAR | Status: DC | PRN
  Administered 2014-10-20: 4 mg via INTRAVENOUS

## 2014-10-20 MED ORDER — LIDOCAINE HCL (CARDIAC) 20 MG/ML IV SOLN
INTRAVENOUS | Status: DC | PRN
  Administered 2014-10-20: 60 mg via INTRAVENOUS

## 2014-10-20 MED ORDER — TOBRAMYCIN-DEXAMETHASONE 0.3-0.1 % OP OINT
TOPICAL_OINTMENT | OPHTHALMIC | Status: DC | PRN
  Administered 2014-10-20: 1 via OPHTHALMIC

## 2014-10-20 MED ORDER — FENTANYL CITRATE (PF) 100 MCG/2ML IJ SOLN
50.0000 ug | INTRAMUSCULAR | Status: DC | PRN
  Administered 2014-10-20: 100 ug via INTRAVENOUS

## 2014-10-20 MED ORDER — LACTATED RINGERS IV SOLN
INTRAVENOUS | Status: DC
  Administered 2014-10-20 (×2): via INTRAVENOUS

## 2014-10-20 MED ORDER — TOBRAMYCIN-DEXAMETHASONE 0.3-0.1 % OP OINT: 1.0000 "application " | TOPICAL_OINTMENT | Freq: Two times a day (BID) | OPHTHALMIC | Status: AC

## 2014-10-20 MED ORDER — ONDANSETRON HCL 4 MG/2ML IJ SOLN
INTRAMUSCULAR | Status: DC | PRN
  Administered 2014-10-20: 4 mg via INTRAVENOUS

## 2014-10-20 MED ORDER — SCOPOLAMINE 1 MG/3DAYS TD PT72: 1.0000 | MEDICATED_PATCH | Freq: Once | TRANSDERMAL | Status: DC | PRN

## 2014-10-20 MED ORDER — GLYCOPYRROLATE 0.2 MG/ML IJ SOLN
0.2000 mg | Freq: Once | INTRAMUSCULAR | Status: AC | PRN
  Administered 2014-10-20: 0.2 mg via INTRAVENOUS
  Administered 2014-10-20: 0.1 mg via INTRAVENOUS

## 2014-10-20 SURGICAL SUPPLY — 30 items
APL SRG 3 HI ABS STRL LF PLS (MISCELLANEOUS) ×1
APPLICATOR COTTON TIP 6IN STRL (MISCELLANEOUS) ×8 IMPLANT
APPLICATOR DR MATTHEWS STRL (MISCELLANEOUS) ×2 IMPLANT
BANDAGE EYE OVAL (MISCELLANEOUS) ×2 IMPLANT
CAUTERY EYE LOW TEMP 1300F FIN (OPHTHALMIC RELATED) IMPLANT
COVER BACK TABLE 60X90IN (DRAPES) ×2 IMPLANT
COVER MAYO STAND STRL (DRAPES) ×2 IMPLANT
DRAPE SURG 17X23 STRL (DRAPES) ×5 IMPLANT
DRAPE U-SHAPE 76X120 STRL (DRAPES) ×1 IMPLANT
GLOVE BIO SURGEON STRL SZ 6.5 (GLOVE) ×2 IMPLANT
GLOVE BIOGEL M STRL SZ7.5 (GLOVE) ×5 IMPLANT
GOWN STRL REUS W/ TWL LRG LVL3 (GOWN DISPOSABLE) ×1 IMPLANT
GOWN STRL REUS W/TWL LRG LVL3 (GOWN DISPOSABLE) ×2
GOWN STRL REUS W/TWL XL LVL3 (GOWN DISPOSABLE) ×2 IMPLANT
NS IRRIG 1000ML POUR BTL (IV SOLUTION) ×2 IMPLANT
PACK BASIN DAY SURGERY FS (CUSTOM PROCEDURE TRAY) ×2 IMPLANT
SHEET MEDIUM DRAPE 40X70 STRL (DRAPES) IMPLANT
SLEEVE SCD COMPRESS KNEE MED (MISCELLANEOUS) ×2 IMPLANT
SPEAR EYE SURG WECK-CEL (MISCELLANEOUS) ×4 IMPLANT
STRIP CLOSURE SKIN 1/4X4 (GAUZE/BANDAGES/DRESSINGS) ×1 IMPLANT
SUT 6 0 SILK T G140 8DA (SUTURE) IMPLANT
SUT MERSILENE 5-0 (SUTURE) IMPLANT
SUT PLAIN 6 0 TG1408 (SUTURE) ×1 IMPLANT
SUT SILK 4 0 C 3 735G (SUTURE) IMPLANT
SUT VICRYL 6 0 S 28 (SUTURE) ×1 IMPLANT
SUT VICRYL ABS 6-0 S29 18IN (SUTURE) ×1 IMPLANT
SYR TB 1ML LL NO SAFETY (SYRINGE) ×2 IMPLANT
SYRINGE 10CC LL (SYRINGE) ×2 IMPLANT
TOWEL OR 17X24 6PK STRL BLUE (TOWEL DISPOSABLE) ×2 IMPLANT
TRAY DSU PREP LF (CUSTOM PROCEDURE TRAY) ×2 IMPLANT

## 2014-10-20 NOTE — Op Note (Signed)
10/20/2014  10:31 AM  PATIENT:  [place] DIAGNOSIS:  1. ESOTROPIA, RECURRENT     2.  RIGHT HYPOTROPIA  POST-OPERATIVE DIAGNOSIS:  same  PROCEDURE:  1.  Right medial rectus muscle re-recession, 3.0 mm (5 mm to 8 mm    2.  Partial tenotomy, right inferior rectus muscle  SURGEON:  Derry Skill, MD  ANESTHESIA:   General  COMPLICATIONS: none  OPERATIVE PROCEDURE: After routine preoperative evaluation including informed consent, the patient was taken to the operative room where he was identified by me. Gen. anesthesia was induced without difficulty after placement of appropriate monitors. The patient was prepped and draped in standard sterile fashion. A lid speculum was placed in the right eye.  Through an inferonasal fornix incision through conjunctiva and T9's fascia, the right inferior rectus muscle was engaged on a series of muscle hooks. The insertion was cleared of conjunctiva and Tenon's fascia so that the entire insertion could be seen. Its width was measured to be 9 mm. The tendon of the inferior rectus muscle was disinserted, beginning at the temporal and, and proceeding 6 mm nasally, leaving the nasal 3 mm of the tendon undisturbed.   Through the same conjunctival incision, the previously recessed right medial rectus muscle was then engaged on a series of muscle hooks and carefully cleared of its surrounding fascial attachments and scar tissue. The tendon was secured with a double-armed 6-0 Vicryl suture, with a locking bite at each border of the muscle, 1 mm from the insertion.  The muscle was disinserted. Its current insertion was measured to be 10.0 mm posterior to the limbus. Each pole suture was passed back into the original muscle stump, 5 mm posterior to the limbus, in crossed swords fashion. The muscle was drawn up to the level of the original insertion. The 2 pole sutures were tied together approximately 10 cm above sclera. With the muscle still  drawn up to the insertion, the tube pole sutures were joined with a needle driver at a measured distance of 8.0 mm millimeters above sclera. A noose knot was tied around the pole sutures at this location. The muscle was allowed to hang back until the noose knot reached sclera, creating an 8 mm hang back recession of the medial rectus muscle.  A 6-0 silk traction suture was placed at the nasal limbus. The inferior portion of the conjunctival incision was closed with a 6-0 plain gut suture. A second 6-0 plain gut suture was used to very loosely apposed the superior portion of the conjunctival incision, leaving the incision open to facilitate suture adjustment.  The pole, noose, traction, and conjunctival sutures were taped to the upper and lower lids. Tobradex ophthalmic ointment was placed in the eye. A sterile eye patch was taped over the eye. The patient was awakened without difficulty and taken to the recovery room in stable condition, having suffered no intraoperative or immediate postoperative competitions.  Derry Skill, MD

## 2014-10-20 NOTE — Anesthesia Procedure Notes (Signed)
Procedure Name: LMA Insertion Date/Time: 10/20/2014 9:15 AM Performed by: Baxter Flattery Pre-anesthesia Checklist: Patient identified, Emergency Drugs available, Suction available and Patient being monitored Patient Re-evaluated:Patient Re-evaluated prior to inductionOxygen Delivery Method: Circle System Utilized Preoxygenation: Pre-oxygenation with 100% oxygen Intubation Type: IV induction Ventilation: Mask ventilation without difficulty LMA: LMA flexible inserted LMA Size: 4.0 Number of attempts: 1 Airway Equipment and Method: Bite block Placement Confirmation: positive ETCO2 and breath sounds checked- equal and bilateral Tube secured with: Tape Dental Injury: Teeth and Oropharynx as per pre-operative assessment

## 2014-10-20 NOTE — H&P (View-Only) (Signed)
  Date of exination:  10-10-14  Indication for surgery: to straighten the eyes and relieve diplopia  Pertinent past medical history:  Past Medical History  Diagnosis Date  . Insomnia   . Allergy   . Anxiety   . Hyperlipidemia   . GERD (gastroesophageal reflux disease)   . Hiatal hernia   . Abdominal pain   . Arthritis   . Gallbladder disease   . Nausea alone   . Enlarged prostate   . Nocturia   . Diabetes mellitus without complication   . Pneumonia     hx  . Anemia     hx    Pertinent ocular history:  Binocular diplopia x 15 years.  Recurrent s/p strabismus surgery x2, currently using vertical and horizontal prism.  Myasthenia bloodwork normal.  DM. Pertinent family history:  Family History  Problem Relation Age of Onset  . Cancer Mother     colon    General:  Healthy appearing patient in no distress.    Eyes:    Acuity  cc OD 20/25  OS 20/20  External: Within normal limits     Anterior segment: Within normal limits x PCIOL OU  Motility:   ET=16, LHT=3.  2-3- elevation OU.  Horizontal ductions normal  Fundus: deferred  Refraction:   Mild cyl approx plano sph equiv OU   Heart: Regular rate and rhythm without murmur     Lungs: Clear to auscultation     Abdomen: Soft, nontender, normal bowel sounds     Impression:Esotropia, recurrent, with left hypertropia  Plan: RMR re-recess adjustable, RIR recess vs. Partial tenotomy  Derry Skill

## 2014-10-20 NOTE — Discharge Instructions (Signed)
°  Post Anesthesia Home Care Instructions  Activity: Get plenty of rest for the remainder of the day. A responsible adult should stay with you for 24 hours following the procedure.  For the next 24 hours, DO NOT: -Drive a car -Paediatric nurse -Drink alcoholic beverages -Take any medication unless instructed by your physician -Make any legal decisions or sign important papers.  Meals: Start with liquid foods such as gelatin or soup. Progress to regular foods as tolerated. Avoid greasy, spicy, heavy foods. If nausea and/or vomiting occur, drink only clear liquids until the nausea and/or vomiting subsides. Call your physician if vomiting continues.  Special Instructions/Symptoms: Your throat may feel dry or sore from the anesthesia or the breathing tube placed in your throat during surgery. If this causes discomfort, gargle with warm salt water. The discomfort should disappear within 24 hours.  If you had a scopolamine patch placed behind your ear for the management of post- operative nausea and/or vomiting:  1. The medication in the patch is effective for 72 hours, after which it should be removed.  Wrap patch in a tissue and discard in the trash. Wash hands thoroughly with soap and water. 2. You may remove the patch earlier than 72 hours if you experience unpleasant side effects which may include dry mouth, dizziness or visual disturbances. 3. Avoid touching the patch. Wash your hands with soap and water after contact with the patch.     Dr. Janee Morn Post-op Instructions  Diet: Clear liquids, advance to soft foods then regular diet as tolerated.  Pain control:   1)  Ibuprofen 600 mg by mouth every 6-8 hours as needed for pain             2)  Ice pack/cold compress to right eye as desired  3)  Percocet 7.5/325 one or two by mouth as needed every 4-6 hours as needed  for pain that is not resolved by ibuprofen  Eye medications:  Tobradex eye ointment 1/2 inch in right eye twice a day  for one week  Activity: No swimming for 1 week.  It is OK to let water run over the face and eyes while showering or taking a bath, even during the first week.  No other restriction on exercise or activity.  Leave patch on right eye till seen in office this afternoon for suture adjustment  Call Dr. Janee Morn office [phone] with any problems or concerns.

## 2014-10-20 NOTE — Interval H&P Note (Signed)
History and Physical Interval Note:  10/20/2014 8:51 AM  Hunter Bridges  has presented today for surgery, with the diagnosis of ESOTROPIA  The various methods of treatment have been discussed with the patient and family. After consideration of risks, benefits and other options for treatment, the patient has consented to  Procedure(s): REPAIR STRABISMUS RIGHT EYE (Right) as a surgical intervention .  The patient's history has been reviewed, patient examined, no change in status, stable for surgery.  I have reviewed the patient's chart and labs.  Questions were answered to the patient's satisfaction.     Derry Skill

## 2014-10-20 NOTE — Anesthesia Postprocedure Evaluation (Signed)
  Anesthesia Post-op Note  Patient: Hunter Bridges  Procedure(s) Performed: Procedure(s): REPAIR STRABISMUS RIGHT EYE (Right)  Patient Location: PACU  Anesthesia Type:General  Level of Consciousness: awake, alert  and oriented  Airway and Oxygen Therapy: Patient Spontanous Breathing  Post-op Pain: mild  Post-op Assessment: Post-op Vital signs reviewed and Patient's Cardiovascular Status Stable              Post-op Vital Signs: Reviewed and stable  Last Vitals:  Filed Vitals:   10/20/14 1100  BP: 139/86  Pulse: 99  Temp:   Resp: 18    Complications: No apparent anesthesia complications

## 2014-10-20 NOTE — Transfer of Care (Signed)
Immediate Anesthesia Transfer of Care Note  Patient: Hunter Bridges  Procedure(s) Performed: Procedure(s): REPAIR STRABISMUS RIGHT EYE (Right)  Patient Location: PACU  Anesthesia Type:General  Level of Consciousness: awake, alert  and oriented  Airway & Oxygen Therapy: Patient Spontanous Breathing and Patient connected to face mask oxygen  Post-op Assessment: Report given to RN, Post -op Vital signs reviewed and stable and Patient moving all extremities  Post vital signs: Reviewed and stable  Last Vitals:  Filed Vitals:   10/20/14 1026  BP:   Pulse: 111  Temp:   Resp: 34    Complications: No apparent anesthesia complications

## 2014-10-20 NOTE — Anesthesia Preprocedure Evaluation (Addendum)
Anesthesia Evaluation  Patient identified by MRN, date of birth, ID band Patient awake    Reviewed: Allergy & Precautions, H&P , NPO status , Patient's Chart, lab work & pertinent test results  Airway Mallampati: II  TM Distance: >3 FB Neck ROM: Full    Dental no notable dental hx. (+) Teeth Intact, Dental Advisory Given   Pulmonary neg pulmonary ROS, former smoker,  breath sounds clear to auscultation  Pulmonary exam normal       Cardiovascular negative cardio ROS  Rhythm:Regular Rate:Normal     Neuro/Psych Anxiety negative neurological ROS  negative psych ROS   GI/Hepatic Neg liver ROS, hiatal hernia, GERD-  Medicated and Controlled,  Endo/Other  diabetes, Type 2, Oral Hypoglycemic Agents  Renal/GU negative Renal ROS  negative genitourinary   Musculoskeletal  (+) Arthritis -, Osteoarthritis,    Abdominal   Peds  Hematology negative hematology ROS (+)   Anesthesia Other Findings   Reproductive/Obstetrics negative OB ROS                            Anesthesia Physical Anesthesia Plan  ASA: II  Anesthesia Plan: General   Post-op Pain Management:    Induction: Intravenous  Airway Management Planned: LMA  Additional Equipment:   Intra-op Plan:   Post-operative Plan: Extubation in OR  Informed Consent: I have reviewed the patients History and Physical, chart, labs and discussed the procedure including the risks, benefits and alternatives for the proposed anesthesia with the patient or authorized representative who has indicated his/her understanding and acceptance.   Dental advisory given  Plan Discussed with: CRNA  Anesthesia Plan Comments:         Anesthesia Quick Evaluation

## 2014-10-24 ENCOUNTER — Encounter (HOSPITAL_BASED_OUTPATIENT_CLINIC_OR_DEPARTMENT_OTHER): Payer: Self-pay | Admitting: Ophthalmology

## 2015-05-07 DIAGNOSIS — Z79899 Other long term (current) drug therapy: Secondary | ICD-10-CM | POA: Diagnosis not present

## 2015-05-07 DIAGNOSIS — E042 Nontoxic multinodular goiter: Secondary | ICD-10-CM | POA: Diagnosis not present

## 2015-05-07 DIAGNOSIS — E784 Other hyperlipidemia: Secondary | ICD-10-CM | POA: Diagnosis not present

## 2015-05-09 DIAGNOSIS — Z1389 Encounter for screening for other disorder: Secondary | ICD-10-CM | POA: Diagnosis not present

## 2015-05-09 DIAGNOSIS — E784 Other hyperlipidemia: Secondary | ICD-10-CM | POA: Diagnosis not present

## 2015-05-09 DIAGNOSIS — D508 Other iron deficiency anemias: Secondary | ICD-10-CM | POA: Diagnosis not present

## 2015-05-09 DIAGNOSIS — E042 Nontoxic multinodular goiter: Secondary | ICD-10-CM | POA: Diagnosis not present

## 2015-05-09 DIAGNOSIS — E1142 Type 2 diabetes mellitus with diabetic polyneuropathy: Secondary | ICD-10-CM | POA: Diagnosis not present

## 2015-05-09 DIAGNOSIS — N3010 Interstitial cystitis (chronic) without hematuria: Secondary | ICD-10-CM | POA: Diagnosis not present

## 2015-05-09 DIAGNOSIS — K219 Gastro-esophageal reflux disease without esophagitis: Secondary | ICD-10-CM | POA: Diagnosis not present

## 2015-05-09 DIAGNOSIS — E1140 Type 2 diabetes mellitus with diabetic neuropathy, unspecified: Secondary | ICD-10-CM | POA: Diagnosis not present

## 2015-05-09 DIAGNOSIS — Z6821 Body mass index (BMI) 21.0-21.9, adult: Secondary | ICD-10-CM | POA: Diagnosis not present

## 2015-05-09 DIAGNOSIS — E871 Hypo-osmolality and hyponatremia: Secondary | ICD-10-CM | POA: Diagnosis not present

## 2015-05-09 DIAGNOSIS — R972 Elevated prostate specific antigen [PSA]: Secondary | ICD-10-CM | POA: Diagnosis not present

## 2015-09-20 DIAGNOSIS — E871 Hypo-osmolality and hyponatremia: Secondary | ICD-10-CM | POA: Diagnosis not present

## 2015-09-20 DIAGNOSIS — E042 Nontoxic multinodular goiter: Secondary | ICD-10-CM | POA: Diagnosis not present

## 2015-09-20 DIAGNOSIS — E784 Other hyperlipidemia: Secondary | ICD-10-CM | POA: Diagnosis not present

## 2015-09-20 DIAGNOSIS — E1140 Type 2 diabetes mellitus with diabetic neuropathy, unspecified: Secondary | ICD-10-CM | POA: Diagnosis not present

## 2015-09-24 DIAGNOSIS — Z6821 Body mass index (BMI) 21.0-21.9, adult: Secondary | ICD-10-CM | POA: Diagnosis not present

## 2015-09-24 DIAGNOSIS — R972 Elevated prostate specific antigen [PSA]: Secondary | ICD-10-CM | POA: Diagnosis not present

## 2015-09-24 DIAGNOSIS — E1142 Type 2 diabetes mellitus with diabetic polyneuropathy: Secondary | ICD-10-CM | POA: Diagnosis not present

## 2015-09-24 DIAGNOSIS — E871 Hypo-osmolality and hyponatremia: Secondary | ICD-10-CM | POA: Diagnosis not present

## 2015-09-24 DIAGNOSIS — E042 Nontoxic multinodular goiter: Secondary | ICD-10-CM | POA: Diagnosis not present

## 2015-09-24 DIAGNOSIS — E784 Other hyperlipidemia: Secondary | ICD-10-CM | POA: Diagnosis not present

## 2015-09-24 DIAGNOSIS — R55 Syncope and collapse: Secondary | ICD-10-CM | POA: Diagnosis not present

## 2015-09-24 DIAGNOSIS — R0989 Other specified symptoms and signs involving the circulatory and respiratory systems: Secondary | ICD-10-CM | POA: Diagnosis not present

## 2015-10-31 ENCOUNTER — Other Ambulatory Visit (HOSPITAL_COMMUNITY): Payer: Self-pay | Admitting: Endocrinology

## 2015-10-31 DIAGNOSIS — R0989 Other specified symptoms and signs involving the circulatory and respiratory systems: Secondary | ICD-10-CM

## 2015-11-01 ENCOUNTER — Ambulatory Visit (HOSPITAL_COMMUNITY)
Admission: RE | Admit: 2015-11-01 | Discharge: 2015-11-01 | Disposition: A | Discharge status: Home / Self Care | Payer: PPO | Source: Ambulatory Visit | Attending: Vascular Surgery | Admitting: Vascular Surgery

## 2015-11-01 DIAGNOSIS — E785 Hyperlipidemia, unspecified: Secondary | ICD-10-CM | POA: Diagnosis not present

## 2015-11-01 DIAGNOSIS — R0989 Other specified symptoms and signs involving the circulatory and respiratory systems: Secondary | ICD-10-CM | POA: Diagnosis not present

## 2015-11-01 DIAGNOSIS — E119 Type 2 diabetes mellitus without complications: Secondary | ICD-10-CM | POA: Insufficient documentation

## 2016-01-17 DIAGNOSIS — E784 Other hyperlipidemia: Secondary | ICD-10-CM | POA: Diagnosis not present

## 2016-01-17 DIAGNOSIS — E042 Nontoxic multinodular goiter: Secondary | ICD-10-CM | POA: Diagnosis not present

## 2016-01-17 DIAGNOSIS — D508 Other iron deficiency anemias: Secondary | ICD-10-CM | POA: Diagnosis not present

## 2016-01-17 DIAGNOSIS — E1142 Type 2 diabetes mellitus with diabetic polyneuropathy: Secondary | ICD-10-CM | POA: Diagnosis not present

## 2016-01-17 DIAGNOSIS — E871 Hypo-osmolality and hyponatremia: Secondary | ICD-10-CM | POA: Diagnosis not present

## 2016-01-22 DIAGNOSIS — D508 Other iron deficiency anemias: Secondary | ICD-10-CM | POA: Diagnosis not present

## 2016-01-22 DIAGNOSIS — N3010 Interstitial cystitis (chronic) without hematuria: Secondary | ICD-10-CM | POA: Diagnosis not present

## 2016-01-22 DIAGNOSIS — K219 Gastro-esophageal reflux disease without esophagitis: Secondary | ICD-10-CM | POA: Diagnosis not present

## 2016-01-22 DIAGNOSIS — E1140 Type 2 diabetes mellitus with diabetic neuropathy, unspecified: Secondary | ICD-10-CM | POA: Diagnosis not present

## 2016-01-22 DIAGNOSIS — E784 Other hyperlipidemia: Secondary | ICD-10-CM | POA: Diagnosis not present

## 2016-01-22 DIAGNOSIS — R1030 Lower abdominal pain, unspecified: Secondary | ICD-10-CM | POA: Diagnosis not present

## 2016-01-22 DIAGNOSIS — E1142 Type 2 diabetes mellitus with diabetic polyneuropathy: Secondary | ICD-10-CM | POA: Diagnosis not present

## 2016-01-22 DIAGNOSIS — E042 Nontoxic multinodular goiter: Secondary | ICD-10-CM | POA: Diagnosis not present

## 2016-01-22 DIAGNOSIS — E871 Hypo-osmolality and hyponatremia: Secondary | ICD-10-CM | POA: Diagnosis not present

## 2016-01-22 DIAGNOSIS — Z6821 Body mass index (BMI) 21.0-21.9, adult: Secondary | ICD-10-CM | POA: Diagnosis not present

## 2016-03-11 DIAGNOSIS — H05241 Constant exophthalmos, right eye: Secondary | ICD-10-CM | POA: Diagnosis not present

## 2016-07-16 DIAGNOSIS — E1140 Type 2 diabetes mellitus with diabetic neuropathy, unspecified: Secondary | ICD-10-CM | POA: Diagnosis not present

## 2016-07-16 DIAGNOSIS — E784 Other hyperlipidemia: Secondary | ICD-10-CM | POA: Diagnosis not present

## 2016-07-16 DIAGNOSIS — E042 Nontoxic multinodular goiter: Secondary | ICD-10-CM | POA: Diagnosis not present

## 2016-07-16 DIAGNOSIS — Z125 Encounter for screening for malignant neoplasm of prostate: Secondary | ICD-10-CM | POA: Diagnosis not present

## 2016-07-21 DIAGNOSIS — E119 Type 2 diabetes mellitus without complications: Secondary | ICD-10-CM | POA: Diagnosis not present

## 2016-07-23 ENCOUNTER — Other Ambulatory Visit (HOSPITAL_COMMUNITY): Payer: Self-pay | Admitting: *Deleted

## 2016-07-23 ENCOUNTER — Other Ambulatory Visit (HOSPITAL_COMMUNITY): Payer: Self-pay | Admitting: Endocrinology

## 2016-07-23 DIAGNOSIS — E1140 Type 2 diabetes mellitus with diabetic neuropathy, unspecified: Secondary | ICD-10-CM | POA: Diagnosis not present

## 2016-07-23 DIAGNOSIS — K219 Gastro-esophageal reflux disease without esophagitis: Secondary | ICD-10-CM | POA: Diagnosis not present

## 2016-07-23 DIAGNOSIS — D508 Other iron deficiency anemias: Secondary | ICD-10-CM | POA: Diagnosis not present

## 2016-07-23 DIAGNOSIS — E875 Hyperkalemia: Secondary | ICD-10-CM

## 2016-07-23 DIAGNOSIS — H0520 Unspecified exophthalmos: Secondary | ICD-10-CM | POA: Diagnosis not present

## 2016-07-23 DIAGNOSIS — I739 Peripheral vascular disease, unspecified: Secondary | ICD-10-CM | POA: Diagnosis not present

## 2016-07-23 DIAGNOSIS — E871 Hypo-osmolality and hyponatremia: Secondary | ICD-10-CM | POA: Diagnosis not present

## 2016-07-23 DIAGNOSIS — E042 Nontoxic multinodular goiter: Secondary | ICD-10-CM | POA: Diagnosis not present

## 2016-07-23 DIAGNOSIS — N3010 Interstitial cystitis (chronic) without hematuria: Secondary | ICD-10-CM | POA: Diagnosis not present

## 2016-07-23 DIAGNOSIS — Z1389 Encounter for screening for other disorder: Secondary | ICD-10-CM | POA: Diagnosis not present

## 2016-07-23 DIAGNOSIS — E1142 Type 2 diabetes mellitus with diabetic polyneuropathy: Secondary | ICD-10-CM | POA: Diagnosis not present

## 2016-07-23 DIAGNOSIS — E784 Other hyperlipidemia: Secondary | ICD-10-CM | POA: Diagnosis not present

## 2016-07-24 DIAGNOSIS — Z1212 Encounter for screening for malignant neoplasm of rectum: Secondary | ICD-10-CM | POA: Diagnosis not present

## 2016-07-30 ENCOUNTER — Ambulatory Visit (HOSPITAL_COMMUNITY)
Admission: RE | Admit: 2016-07-30 | Discharge: 2016-07-30 | Disposition: A | Discharge status: Home / Self Care | Payer: PPO | Source: Ambulatory Visit | Attending: Endocrinology | Admitting: Endocrinology

## 2016-07-30 DIAGNOSIS — E875 Hyperkalemia: Secondary | ICD-10-CM

## 2016-07-30 DIAGNOSIS — H0520 Unspecified exophthalmos: Secondary | ICD-10-CM

## 2016-07-30 DIAGNOSIS — E871 Hypo-osmolality and hyponatremia: Secondary | ICD-10-CM

## 2016-07-30 LAB — POCT I-STAT CREATININE: Creatinine, Ser: 0.8 mg/dL (ref 0.61–1.24)

## 2016-07-30 MED ORDER — GADOBENATE DIMEGLUMINE 529 MG/ML IV SOLN
15.0000 mL | Freq: Once | INTRAVENOUS | Status: AC | PRN
  Administered 2016-07-30: 14 mL via INTRAVENOUS

## 2016-08-04 ENCOUNTER — Ambulatory Visit (HOSPITAL_COMMUNITY)
Admission: RE | Admit: 2016-08-04 | Discharge: 2016-08-04 | Disposition: A | Discharge status: Home / Self Care | Payer: PPO | Source: Ambulatory Visit | Attending: Endocrinology | Admitting: Endocrinology

## 2016-08-04 ENCOUNTER — Encounter (HOSPITAL_COMMUNITY): Payer: Self-pay

## 2016-08-04 DIAGNOSIS — E2740 Unspecified adrenocortical insufficiency: Secondary | ICD-10-CM | POA: Diagnosis not present

## 2016-08-04 LAB — ACTH STIMULATION, 3 TIME POINTS
CORTISOL BASE: 8.5 ug/dL
Cortisol, 30 Min: 18.9 ug/dL
Cortisol, 60 Min: 23.7 ug/dL

## 2016-08-04 MED ORDER — COSYNTROPIN 0.25 MG IJ SOLR
0.2500 mg | Freq: Once | INTRAMUSCULAR | Status: AC
  Administered 2016-08-04: 0.25 mg via INTRAVENOUS
  Filled 2016-08-04: qty 0.25

## 2016-08-04 NOTE — Progress Notes (Signed)
Initiated Cosyntropin Test at 0830 with initial blood draw and cosyntropin dose 0845, Pt reports that he did eat breakfast prior to appointment.  Called Dr [facility] office and notified medical staff, was directed to continue testing.

## 2016-08-04 NOTE — Discharge Instructions (Signed)
ACTH Stimulation Test with Cosyntropin IV injection

## 2016-10-10 DIAGNOSIS — R42 Dizziness and giddiness: Secondary | ICD-10-CM | POA: Diagnosis not present

## 2016-10-10 DIAGNOSIS — H903 Sensorineural hearing loss, bilateral: Secondary | ICD-10-CM | POA: Diagnosis not present

## 2016-10-10 DIAGNOSIS — H9113 Presbycusis, bilateral: Secondary | ICD-10-CM | POA: Diagnosis not present

## 2016-11-26 ENCOUNTER — Encounter (INDEPENDENT_AMBULATORY_CARE_PROVIDER_SITE_OTHER): Payer: Self-pay | Admitting: Ophthalmology

## 2016-12-16 DIAGNOSIS — E042 Nontoxic multinodular goiter: Secondary | ICD-10-CM | POA: Diagnosis not present

## 2016-12-16 DIAGNOSIS — E1140 Type 2 diabetes mellitus with diabetic neuropathy, unspecified: Secondary | ICD-10-CM | POA: Diagnosis not present

## 2016-12-16 DIAGNOSIS — E1142 Type 2 diabetes mellitus with diabetic polyneuropathy: Secondary | ICD-10-CM | POA: Diagnosis not present

## 2016-12-16 DIAGNOSIS — Z6820 Body mass index (BMI) 20.0-20.9, adult: Secondary | ICD-10-CM | POA: Diagnosis not present

## 2016-12-16 DIAGNOSIS — E871 Hypo-osmolality and hyponatremia: Secondary | ICD-10-CM | POA: Diagnosis not present

## 2016-12-16 DIAGNOSIS — R972 Elevated prostate specific antigen [PSA]: Secondary | ICD-10-CM | POA: Diagnosis not present

## 2016-12-16 DIAGNOSIS — R42 Dizziness and giddiness: Secondary | ICD-10-CM | POA: Diagnosis not present

## 2016-12-16 DIAGNOSIS — N3010 Interstitial cystitis (chronic) without hematuria: Secondary | ICD-10-CM | POA: Diagnosis not present

## 2016-12-16 DIAGNOSIS — D508 Other iron deficiency anemias: Secondary | ICD-10-CM | POA: Diagnosis not present

## 2016-12-16 DIAGNOSIS — E7849 Other hyperlipidemia: Secondary | ICD-10-CM | POA: Diagnosis not present

## 2016-12-18 ENCOUNTER — Encounter (INDEPENDENT_AMBULATORY_CARE_PROVIDER_SITE_OTHER): Payer: PPO | Admitting: Ophthalmology

## 2016-12-18 DIAGNOSIS — H35033 Hypertensive retinopathy, bilateral: Secondary | ICD-10-CM | POA: Diagnosis not present

## 2016-12-18 DIAGNOSIS — H43813 Vitreous degeneration, bilateral: Secondary | ICD-10-CM | POA: Diagnosis not present

## 2016-12-18 DIAGNOSIS — H353122 Nonexudative age-related macular degeneration, left eye, intermediate dry stage: Secondary | ICD-10-CM

## 2016-12-18 DIAGNOSIS — H353111 Nonexudative age-related macular degeneration, right eye, early dry stage: Secondary | ICD-10-CM

## 2016-12-18 DIAGNOSIS — I10 Essential (primary) hypertension: Secondary | ICD-10-CM

## 2017-01-13 DIAGNOSIS — R42 Dizziness and giddiness: Secondary | ICD-10-CM | POA: Diagnosis not present

## 2017-01-13 DIAGNOSIS — H903 Sensorineural hearing loss, bilateral: Secondary | ICD-10-CM | POA: Diagnosis not present

## 2017-06-11 DIAGNOSIS — Z1321 Encounter for screening for nutritional disorder: Secondary | ICD-10-CM | POA: Diagnosis not present

## 2017-06-11 DIAGNOSIS — E1140 Type 2 diabetes mellitus with diabetic neuropathy, unspecified: Secondary | ICD-10-CM | POA: Diagnosis not present

## 2017-06-11 DIAGNOSIS — E042 Nontoxic multinodular goiter: Secondary | ICD-10-CM | POA: Diagnosis not present

## 2017-06-11 DIAGNOSIS — Z0000 Encounter for general adult medical examination without abnormal findings: Secondary | ICD-10-CM | POA: Diagnosis not present

## 2017-06-11 DIAGNOSIS — E7849 Other hyperlipidemia: Secondary | ICD-10-CM | POA: Diagnosis not present

## 2017-06-11 DIAGNOSIS — Z125 Encounter for screening for malignant neoplasm of prostate: Secondary | ICD-10-CM | POA: Diagnosis not present

## 2017-06-16 DIAGNOSIS — R0989 Other specified symptoms and signs involving the circulatory and respiratory systems: Secondary | ICD-10-CM | POA: Diagnosis not present

## 2017-06-16 DIAGNOSIS — Z0000 Encounter for general adult medical examination without abnormal findings: Secondary | ICD-10-CM | POA: Diagnosis not present

## 2017-06-16 DIAGNOSIS — R42 Dizziness and giddiness: Secondary | ICD-10-CM | POA: Diagnosis not present

## 2017-06-16 DIAGNOSIS — H0520 Unspecified exophthalmos: Secondary | ICD-10-CM | POA: Diagnosis not present

## 2017-06-16 DIAGNOSIS — N3010 Interstitial cystitis (chronic) without hematuria: Secondary | ICD-10-CM | POA: Diagnosis not present

## 2017-06-16 DIAGNOSIS — E1140 Type 2 diabetes mellitus with diabetic neuropathy, unspecified: Secondary | ICD-10-CM | POA: Diagnosis not present

## 2017-06-16 DIAGNOSIS — Z6823 Body mass index (BMI) 23.0-23.9, adult: Secondary | ICD-10-CM | POA: Diagnosis not present

## 2017-06-16 DIAGNOSIS — D508 Other iron deficiency anemias: Secondary | ICD-10-CM | POA: Diagnosis not present

## 2017-06-16 DIAGNOSIS — E7849 Other hyperlipidemia: Secondary | ICD-10-CM | POA: Diagnosis not present

## 2017-06-16 DIAGNOSIS — E042 Nontoxic multinodular goiter: Secondary | ICD-10-CM | POA: Diagnosis not present

## 2017-06-16 DIAGNOSIS — H9319 Tinnitus, unspecified ear: Secondary | ICD-10-CM | POA: Diagnosis not present

## 2017-06-16 DIAGNOSIS — I499 Cardiac arrhythmia, unspecified: Secondary | ICD-10-CM | POA: Diagnosis not present

## 2017-10-19 DIAGNOSIS — E1140 Type 2 diabetes mellitus with diabetic neuropathy, unspecified: Secondary | ICD-10-CM | POA: Diagnosis not present

## 2017-10-19 DIAGNOSIS — Z125 Encounter for screening for malignant neoplasm of prostate: Secondary | ICD-10-CM | POA: Diagnosis not present

## 2017-10-19 DIAGNOSIS — E042 Nontoxic multinodular goiter: Secondary | ICD-10-CM | POA: Diagnosis not present

## 2017-10-19 DIAGNOSIS — Z79899 Other long term (current) drug therapy: Secondary | ICD-10-CM | POA: Diagnosis not present

## 2017-10-19 DIAGNOSIS — E7849 Other hyperlipidemia: Secondary | ICD-10-CM | POA: Diagnosis not present

## 2017-10-20 DIAGNOSIS — E871 Hypo-osmolality and hyponatremia: Secondary | ICD-10-CM | POA: Diagnosis not present

## 2017-10-20 DIAGNOSIS — I739 Peripheral vascular disease, unspecified: Secondary | ICD-10-CM | POA: Diagnosis not present

## 2017-10-20 DIAGNOSIS — E042 Nontoxic multinodular goiter: Secondary | ICD-10-CM | POA: Diagnosis not present

## 2017-10-20 DIAGNOSIS — E1142 Type 2 diabetes mellitus with diabetic polyneuropathy: Secondary | ICD-10-CM | POA: Diagnosis not present

## 2017-10-20 DIAGNOSIS — H0520 Unspecified exophthalmos: Secondary | ICD-10-CM | POA: Diagnosis not present

## 2017-10-20 DIAGNOSIS — D508 Other iron deficiency anemias: Secondary | ICD-10-CM | POA: Diagnosis not present

## 2017-10-20 DIAGNOSIS — E1140 Type 2 diabetes mellitus with diabetic neuropathy, unspecified: Secondary | ICD-10-CM | POA: Diagnosis not present

## 2017-10-20 DIAGNOSIS — Z6823 Body mass index (BMI) 23.0-23.9, adult: Secondary | ICD-10-CM | POA: Diagnosis not present

## 2017-10-20 DIAGNOSIS — R42 Dizziness and giddiness: Secondary | ICD-10-CM | POA: Diagnosis not present

## 2017-10-20 DIAGNOSIS — E7849 Other hyperlipidemia: Secondary | ICD-10-CM | POA: Diagnosis not present

## 2017-10-20 DIAGNOSIS — R972 Elevated prostate specific antigen [PSA]: Secondary | ICD-10-CM | POA: Diagnosis not present

## 2017-11-25 DIAGNOSIS — E042 Nontoxic multinodular goiter: Secondary | ICD-10-CM | POA: Diagnosis not present

## 2018-02-01 DIAGNOSIS — Z125 Encounter for screening for malignant neoplasm of prostate: Secondary | ICD-10-CM | POA: Diagnosis not present

## 2018-02-01 DIAGNOSIS — D508 Other iron deficiency anemias: Secondary | ICD-10-CM | POA: Diagnosis not present

## 2018-02-01 DIAGNOSIS — E119 Type 2 diabetes mellitus without complications: Secondary | ICD-10-CM | POA: Diagnosis not present

## 2018-02-01 DIAGNOSIS — E042 Nontoxic multinodular goiter: Secondary | ICD-10-CM | POA: Diagnosis not present

## 2018-02-01 DIAGNOSIS — R5383 Other fatigue: Secondary | ICD-10-CM | POA: Diagnosis not present

## 2018-02-01 DIAGNOSIS — R300 Dysuria: Secondary | ICD-10-CM | POA: Diagnosis not present

## 2018-02-01 DIAGNOSIS — E7849 Other hyperlipidemia: Secondary | ICD-10-CM | POA: Diagnosis not present

## 2018-02-01 DIAGNOSIS — R972 Elevated prostate specific antigen [PSA]: Secondary | ICD-10-CM | POA: Diagnosis not present

## 2018-02-03 DIAGNOSIS — N3010 Interstitial cystitis (chronic) without hematuria: Secondary | ICD-10-CM | POA: Diagnosis not present

## 2018-02-03 DIAGNOSIS — E042 Nontoxic multinodular goiter: Secondary | ICD-10-CM | POA: Diagnosis not present

## 2018-02-03 DIAGNOSIS — E1140 Type 2 diabetes mellitus with diabetic neuropathy, unspecified: Secondary | ICD-10-CM | POA: Diagnosis not present

## 2018-02-03 DIAGNOSIS — H0520 Unspecified exophthalmos: Secondary | ICD-10-CM | POA: Diagnosis not present

## 2018-02-03 DIAGNOSIS — E871 Hypo-osmolality and hyponatremia: Secondary | ICD-10-CM | POA: Diagnosis not present

## 2018-02-03 DIAGNOSIS — E1142 Type 2 diabetes mellitus with diabetic polyneuropathy: Secondary | ICD-10-CM | POA: Diagnosis not present

## 2018-02-03 DIAGNOSIS — E119 Type 2 diabetes mellitus without complications: Secondary | ICD-10-CM | POA: Diagnosis not present

## 2018-02-03 DIAGNOSIS — K219 Gastro-esophageal reflux disease without esophagitis: Secondary | ICD-10-CM | POA: Diagnosis not present

## 2018-02-03 DIAGNOSIS — E7849 Other hyperlipidemia: Secondary | ICD-10-CM | POA: Diagnosis not present

## 2018-02-03 DIAGNOSIS — Z6822 Body mass index (BMI) 22.0-22.9, adult: Secondary | ICD-10-CM | POA: Diagnosis not present

## 2018-02-03 DIAGNOSIS — D508 Other iron deficiency anemias: Secondary | ICD-10-CM | POA: Diagnosis not present

## 2018-02-03 DIAGNOSIS — I739 Peripheral vascular disease, unspecified: Secondary | ICD-10-CM | POA: Diagnosis not present

## 2018-02-22 DIAGNOSIS — H16223 Keratoconjunctivitis sicca, not specified as Sjogren's, bilateral: Secondary | ICD-10-CM | POA: Diagnosis not present

## 2018-02-22 DIAGNOSIS — H04123 Dry eye syndrome of bilateral lacrimal glands: Secondary | ICD-10-CM | POA: Diagnosis not present

## 2018-02-22 DIAGNOSIS — Z961 Presence of intraocular lens: Secondary | ICD-10-CM | POA: Diagnosis not present

## 2018-02-22 DIAGNOSIS — H01009 Unspecified blepharitis unspecified eye, unspecified eyelid: Secondary | ICD-10-CM | POA: Diagnosis not present

## 2018-09-01 DIAGNOSIS — E871 Hypo-osmolality and hyponatremia: Secondary | ICD-10-CM | POA: Diagnosis not present

## 2018-09-01 DIAGNOSIS — H0520 Unspecified exophthalmos: Secondary | ICD-10-CM | POA: Diagnosis not present

## 2018-09-01 DIAGNOSIS — E1140 Type 2 diabetes mellitus with diabetic neuropathy, unspecified: Secondary | ICD-10-CM | POA: Diagnosis not present

## 2018-09-01 DIAGNOSIS — E1142 Type 2 diabetes mellitus with diabetic polyneuropathy: Secondary | ICD-10-CM | POA: Diagnosis not present

## 2018-09-01 DIAGNOSIS — N3010 Interstitial cystitis (chronic) without hematuria: Secondary | ICD-10-CM | POA: Diagnosis not present

## 2018-09-01 DIAGNOSIS — M5416 Radiculopathy, lumbar region: Secondary | ICD-10-CM | POA: Diagnosis not present

## 2018-09-01 DIAGNOSIS — E042 Nontoxic multinodular goiter: Secondary | ICD-10-CM | POA: Diagnosis not present

## 2018-09-01 DIAGNOSIS — R42 Dizziness and giddiness: Secondary | ICD-10-CM | POA: Diagnosis not present

## 2018-09-01 DIAGNOSIS — E785 Hyperlipidemia, unspecified: Secondary | ICD-10-CM | POA: Diagnosis not present

## 2018-09-01 DIAGNOSIS — R972 Elevated prostate specific antigen [PSA]: Secondary | ICD-10-CM | POA: Diagnosis not present

## 2018-09-01 DIAGNOSIS — K219 Gastro-esophageal reflux disease without esophagitis: Secondary | ICD-10-CM | POA: Diagnosis not present

## 2018-09-01 DIAGNOSIS — D508 Other iron deficiency anemias: Secondary | ICD-10-CM | POA: Diagnosis not present

## 2018-12-28 ENCOUNTER — Encounter (INDEPENDENT_AMBULATORY_CARE_PROVIDER_SITE_OTHER): Payer: Self-pay

## 2019-03-09 DIAGNOSIS — E042 Nontoxic multinodular goiter: Secondary | ICD-10-CM | POA: Diagnosis not present

## 2019-03-09 DIAGNOSIS — E1142 Type 2 diabetes mellitus with diabetic polyneuropathy: Secondary | ICD-10-CM | POA: Diagnosis not present

## 2019-03-09 DIAGNOSIS — E119 Type 2 diabetes mellitus without complications: Secondary | ICD-10-CM | POA: Diagnosis not present

## 2019-03-09 DIAGNOSIS — K219 Gastro-esophageal reflux disease without esophagitis: Secondary | ICD-10-CM | POA: Diagnosis not present

## 2019-03-09 DIAGNOSIS — I739 Peripheral vascular disease, unspecified: Secondary | ICD-10-CM | POA: Diagnosis not present

## 2019-03-09 DIAGNOSIS — E785 Hyperlipidemia, unspecified: Secondary | ICD-10-CM | POA: Diagnosis not present

## 2019-03-09 DIAGNOSIS — M5416 Radiculopathy, lumbar region: Secondary | ICD-10-CM | POA: Diagnosis not present

## 2019-05-01 ENCOUNTER — Ambulatory Visit: Payer: PPO

## 2019-05-05 ENCOUNTER — Ambulatory Visit: Payer: PPO

## 2019-05-05 ENCOUNTER — Telehealth: Payer: Self-pay | Admitting: *Deleted

## 2019-05-05 NOTE — Telephone Encounter (Signed)
Called to cancel today's vaccine appointment. He is not feeling well.

## 2019-06-30 DIAGNOSIS — E042 Nontoxic multinodular goiter: Secondary | ICD-10-CM | POA: Diagnosis not present

## 2019-06-30 DIAGNOSIS — E1140 Type 2 diabetes mellitus with diabetic neuropathy, unspecified: Secondary | ICD-10-CM | POA: Diagnosis not present

## 2019-06-30 DIAGNOSIS — D508 Other iron deficiency anemias: Secondary | ICD-10-CM | POA: Diagnosis not present

## 2019-06-30 DIAGNOSIS — E7849 Other hyperlipidemia: Secondary | ICD-10-CM | POA: Diagnosis not present

## 2019-09-14 DIAGNOSIS — H9319 Tinnitus, unspecified ear: Secondary | ICD-10-CM | POA: Diagnosis not present

## 2019-09-14 DIAGNOSIS — D508 Other iron deficiency anemias: Secondary | ICD-10-CM | POA: Diagnosis not present

## 2019-09-14 DIAGNOSIS — R42 Dizziness and giddiness: Secondary | ICD-10-CM | POA: Diagnosis not present

## 2019-09-14 DIAGNOSIS — E871 Hypo-osmolality and hyponatremia: Secondary | ICD-10-CM | POA: Diagnosis not present

## 2019-09-14 DIAGNOSIS — E119 Type 2 diabetes mellitus without complications: Secondary | ICD-10-CM | POA: Diagnosis not present

## 2019-09-14 DIAGNOSIS — E1142 Type 2 diabetes mellitus with diabetic polyneuropathy: Secondary | ICD-10-CM | POA: Diagnosis not present

## 2019-09-14 DIAGNOSIS — E042 Nontoxic multinodular goiter: Secondary | ICD-10-CM | POA: Diagnosis not present

## 2019-09-14 DIAGNOSIS — K219 Gastro-esophageal reflux disease without esophagitis: Secondary | ICD-10-CM | POA: Diagnosis not present

## 2019-09-14 DIAGNOSIS — E785 Hyperlipidemia, unspecified: Secondary | ICD-10-CM | POA: Diagnosis not present

## 2019-09-14 DIAGNOSIS — E1140 Type 2 diabetes mellitus with diabetic neuropathy, unspecified: Secondary | ICD-10-CM | POA: Diagnosis not present

## 2020-01-10 DIAGNOSIS — E1140 Type 2 diabetes mellitus with diabetic neuropathy, unspecified: Secondary | ICD-10-CM | POA: Diagnosis not present

## 2020-01-10 DIAGNOSIS — D508 Other iron deficiency anemias: Secondary | ICD-10-CM | POA: Diagnosis not present

## 2020-01-10 DIAGNOSIS — E785 Hyperlipidemia, unspecified: Secondary | ICD-10-CM | POA: Diagnosis not present

## 2020-01-10 DIAGNOSIS — D649 Anemia, unspecified: Secondary | ICD-10-CM | POA: Diagnosis not present

## 2020-01-13 DIAGNOSIS — K219 Gastro-esophageal reflux disease without esophagitis: Secondary | ICD-10-CM | POA: Diagnosis not present

## 2020-01-13 DIAGNOSIS — N3010 Interstitial cystitis (chronic) without hematuria: Secondary | ICD-10-CM | POA: Diagnosis not present

## 2020-01-13 DIAGNOSIS — H0520 Unspecified exophthalmos: Secondary | ICD-10-CM | POA: Diagnosis not present

## 2020-01-13 DIAGNOSIS — Z23 Encounter for immunization: Secondary | ICD-10-CM | POA: Diagnosis not present

## 2020-01-13 DIAGNOSIS — E1140 Type 2 diabetes mellitus with diabetic neuropathy, unspecified: Secondary | ICD-10-CM | POA: Diagnosis not present

## 2020-01-13 DIAGNOSIS — E785 Hyperlipidemia, unspecified: Secondary | ICD-10-CM | POA: Diagnosis not present

## 2020-01-13 DIAGNOSIS — M5416 Radiculopathy, lumbar region: Secondary | ICD-10-CM | POA: Diagnosis not present

## 2020-01-13 DIAGNOSIS — I739 Peripheral vascular disease, unspecified: Secondary | ICD-10-CM | POA: Diagnosis not present

## 2020-01-13 DIAGNOSIS — D508 Other iron deficiency anemias: Secondary | ICD-10-CM | POA: Diagnosis not present

## 2020-01-13 DIAGNOSIS — E871 Hypo-osmolality and hyponatremia: Secondary | ICD-10-CM | POA: Diagnosis not present

## 2020-05-08 DIAGNOSIS — E785 Hyperlipidemia, unspecified: Secondary | ICD-10-CM | POA: Diagnosis not present

## 2020-05-08 DIAGNOSIS — D508 Other iron deficiency anemias: Secondary | ICD-10-CM | POA: Diagnosis not present

## 2020-05-08 DIAGNOSIS — E1140 Type 2 diabetes mellitus with diabetic neuropathy, unspecified: Secondary | ICD-10-CM | POA: Diagnosis not present

## 2020-05-08 DIAGNOSIS — D649 Anemia, unspecified: Secondary | ICD-10-CM | POA: Diagnosis not present

## 2020-05-10 DIAGNOSIS — E785 Hyperlipidemia, unspecified: Secondary | ICD-10-CM | POA: Diagnosis not present

## 2020-05-10 DIAGNOSIS — E875 Hyperkalemia: Secondary | ICD-10-CM | POA: Diagnosis not present

## 2020-05-10 DIAGNOSIS — N3010 Interstitial cystitis (chronic) without hematuria: Secondary | ICD-10-CM | POA: Diagnosis not present

## 2020-05-10 DIAGNOSIS — I499 Cardiac arrhythmia, unspecified: Secondary | ICD-10-CM | POA: Diagnosis not present

## 2020-05-10 DIAGNOSIS — H9319 Tinnitus, unspecified ear: Secondary | ICD-10-CM | POA: Diagnosis not present

## 2020-05-10 DIAGNOSIS — K219 Gastro-esophageal reflux disease without esophagitis: Secondary | ICD-10-CM | POA: Diagnosis not present

## 2020-05-10 DIAGNOSIS — E1140 Type 2 diabetes mellitus with diabetic neuropathy, unspecified: Secondary | ICD-10-CM | POA: Diagnosis not present

## 2020-05-10 DIAGNOSIS — E871 Hypo-osmolality and hyponatremia: Secondary | ICD-10-CM | POA: Diagnosis not present

## 2020-05-10 DIAGNOSIS — I739 Peripheral vascular disease, unspecified: Secondary | ICD-10-CM | POA: Diagnosis not present

## 2020-05-10 DIAGNOSIS — D508 Other iron deficiency anemias: Secondary | ICD-10-CM | POA: Diagnosis not present

## 2020-05-10 DIAGNOSIS — H0520 Unspecified exophthalmos: Secondary | ICD-10-CM | POA: Diagnosis not present

## 2020-05-10 DIAGNOSIS — E042 Nontoxic multinodular goiter: Secondary | ICD-10-CM | POA: Diagnosis not present

## 2020-05-10 DIAGNOSIS — M79604 Pain in right leg: Secondary | ICD-10-CM | POA: Diagnosis not present

## 2020-05-10 DIAGNOSIS — M5416 Radiculopathy, lumbar region: Secondary | ICD-10-CM | POA: Diagnosis not present

## 2020-07-09 DIAGNOSIS — Z23 Encounter for immunization: Secondary | ICD-10-CM | POA: Diagnosis not present

## 2020-11-08 DIAGNOSIS — E875 Hyperkalemia: Secondary | ICD-10-CM | POA: Diagnosis not present

## 2020-11-08 DIAGNOSIS — H9319 Tinnitus, unspecified ear: Secondary | ICD-10-CM | POA: Diagnosis not present

## 2020-11-08 DIAGNOSIS — K219 Gastro-esophageal reflux disease without esophagitis: Secondary | ICD-10-CM | POA: Diagnosis not present

## 2020-11-08 DIAGNOSIS — N3010 Interstitial cystitis (chronic) without hematuria: Secondary | ICD-10-CM | POA: Diagnosis not present

## 2020-11-08 DIAGNOSIS — M5416 Radiculopathy, lumbar region: Secondary | ICD-10-CM | POA: Diagnosis not present

## 2020-11-08 DIAGNOSIS — I499 Cardiac arrhythmia, unspecified: Secondary | ICD-10-CM | POA: Diagnosis not present

## 2020-11-08 DIAGNOSIS — I739 Peripheral vascular disease, unspecified: Secondary | ICD-10-CM | POA: Diagnosis not present

## 2020-11-08 DIAGNOSIS — M79604 Pain in right leg: Secondary | ICD-10-CM | POA: Diagnosis not present

## 2020-11-08 DIAGNOSIS — E1140 Type 2 diabetes mellitus with diabetic neuropathy, unspecified: Secondary | ICD-10-CM | POA: Diagnosis not present

## 2020-11-08 DIAGNOSIS — E785 Hyperlipidemia, unspecified: Secondary | ICD-10-CM | POA: Diagnosis not present

## 2020-11-08 DIAGNOSIS — E871 Hypo-osmolality and hyponatremia: Secondary | ICD-10-CM | POA: Diagnosis not present

## 2020-11-08 DIAGNOSIS — Z23 Encounter for immunization: Secondary | ICD-10-CM | POA: Diagnosis not present

## 2020-12-31 ENCOUNTER — Other Ambulatory Visit: Payer: Self-pay

## 2020-12-31 ENCOUNTER — Encounter (INDEPENDENT_AMBULATORY_CARE_PROVIDER_SITE_OTHER): Payer: PPO | Admitting: Ophthalmology

## 2020-12-31 DIAGNOSIS — H43813 Vitreous degeneration, bilateral: Secondary | ICD-10-CM | POA: Diagnosis not present

## 2020-12-31 DIAGNOSIS — H353132 Nonexudative age-related macular degeneration, bilateral, intermediate dry stage: Secondary | ICD-10-CM

## 2020-12-31 DIAGNOSIS — I10 Essential (primary) hypertension: Secondary | ICD-10-CM | POA: Diagnosis not present

## 2020-12-31 DIAGNOSIS — H35033 Hypertensive retinopathy, bilateral: Secondary | ICD-10-CM | POA: Diagnosis not present

## 2021-01-28 DIAGNOSIS — H532 Diplopia: Secondary | ICD-10-CM | POA: Diagnosis not present

## 2021-01-28 DIAGNOSIS — Z961 Presence of intraocular lens: Secondary | ICD-10-CM | POA: Diagnosis not present

## 2021-01-28 DIAGNOSIS — H04123 Dry eye syndrome of bilateral lacrimal glands: Secondary | ICD-10-CM | POA: Diagnosis not present

## 2021-03-03 HISTORY — PX: UPPER GI ENDOSCOPY: SHX6162

## 2021-05-07 DIAGNOSIS — E785 Hyperlipidemia, unspecified: Secondary | ICD-10-CM | POA: Diagnosis not present

## 2021-05-07 DIAGNOSIS — Z79899 Other long term (current) drug therapy: Secondary | ICD-10-CM | POA: Diagnosis not present

## 2021-05-07 DIAGNOSIS — D508 Other iron deficiency anemias: Secondary | ICD-10-CM | POA: Diagnosis not present

## 2021-05-07 DIAGNOSIS — E042 Nontoxic multinodular goiter: Secondary | ICD-10-CM | POA: Diagnosis not present

## 2021-05-07 DIAGNOSIS — Z125 Encounter for screening for malignant neoplasm of prostate: Secondary | ICD-10-CM | POA: Diagnosis not present

## 2021-05-07 DIAGNOSIS — E1140 Type 2 diabetes mellitus with diabetic neuropathy, unspecified: Secondary | ICD-10-CM | POA: Diagnosis not present

## 2021-05-07 DIAGNOSIS — R7989 Other specified abnormal findings of blood chemistry: Secondary | ICD-10-CM | POA: Diagnosis not present

## 2021-05-07 DIAGNOSIS — E871 Hypo-osmolality and hyponatremia: Secondary | ICD-10-CM | POA: Diagnosis not present

## 2021-05-14 DIAGNOSIS — Z1339 Encounter for screening examination for other mental health and behavioral disorders: Secondary | ICD-10-CM | POA: Diagnosis not present

## 2021-05-14 DIAGNOSIS — M5416 Radiculopathy, lumbar region: Secondary | ICD-10-CM | POA: Diagnosis not present

## 2021-05-14 DIAGNOSIS — E875 Hyperkalemia: Secondary | ICD-10-CM | POA: Diagnosis not present

## 2021-05-14 DIAGNOSIS — E785 Hyperlipidemia, unspecified: Secondary | ICD-10-CM | POA: Diagnosis not present

## 2021-05-14 DIAGNOSIS — Z0000 Encounter for general adult medical examination without abnormal findings: Secondary | ICD-10-CM | POA: Diagnosis not present

## 2021-05-14 DIAGNOSIS — E1140 Type 2 diabetes mellitus with diabetic neuropathy, unspecified: Secondary | ICD-10-CM | POA: Diagnosis not present

## 2021-05-14 DIAGNOSIS — Z1331 Encounter for screening for depression: Secondary | ICD-10-CM | POA: Diagnosis not present

## 2021-05-14 DIAGNOSIS — E042 Nontoxic multinodular goiter: Secondary | ICD-10-CM | POA: Diagnosis not present

## 2021-05-14 DIAGNOSIS — I499 Cardiac arrhythmia, unspecified: Secondary | ICD-10-CM | POA: Diagnosis not present

## 2021-05-14 DIAGNOSIS — Z1212 Encounter for screening for malignant neoplasm of rectum: Secondary | ICD-10-CM | POA: Diagnosis not present

## 2021-05-14 DIAGNOSIS — N3010 Interstitial cystitis (chronic) without hematuria: Secondary | ICD-10-CM | POA: Diagnosis not present

## 2021-05-14 DIAGNOSIS — D508 Other iron deficiency anemias: Secondary | ICD-10-CM | POA: Diagnosis not present

## 2021-05-14 DIAGNOSIS — R82998 Other abnormal findings in urine: Secondary | ICD-10-CM | POA: Diagnosis not present

## 2021-05-14 DIAGNOSIS — I739 Peripheral vascular disease, unspecified: Secondary | ICD-10-CM | POA: Diagnosis not present

## 2021-07-25 DIAGNOSIS — N3941 Urge incontinence: Secondary | ICD-10-CM | POA: Diagnosis not present

## 2021-07-25 DIAGNOSIS — R300 Dysuria: Secondary | ICD-10-CM | POA: Diagnosis not present

## 2021-07-25 DIAGNOSIS — N400 Enlarged prostate without lower urinary tract symptoms: Secondary | ICD-10-CM | POA: Diagnosis not present

## 2021-07-25 DIAGNOSIS — N3010 Interstitial cystitis (chronic) without hematuria: Secondary | ICD-10-CM | POA: Diagnosis not present

## 2021-08-13 DIAGNOSIS — Z1211 Encounter for screening for malignant neoplasm of colon: Secondary | ICD-10-CM | POA: Diagnosis not present

## 2021-08-13 DIAGNOSIS — K5730 Diverticulosis of large intestine without perforation or abscess without bleeding: Secondary | ICD-10-CM | POA: Diagnosis not present

## 2021-08-13 DIAGNOSIS — K219 Gastro-esophageal reflux disease without esophagitis: Secondary | ICD-10-CM | POA: Diagnosis not present

## 2021-08-13 DIAGNOSIS — D509 Iron deficiency anemia, unspecified: Secondary | ICD-10-CM | POA: Diagnosis not present

## 2021-09-19 DIAGNOSIS — R3911 Hesitancy of micturition: Secondary | ICD-10-CM | POA: Diagnosis not present

## 2021-09-19 DIAGNOSIS — R351 Nocturia: Secondary | ICD-10-CM | POA: Diagnosis not present

## 2021-09-19 DIAGNOSIS — N401 Enlarged prostate with lower urinary tract symptoms: Secondary | ICD-10-CM | POA: Diagnosis not present

## 2021-10-14 DIAGNOSIS — Z800 Family history of malignant neoplasm of digestive organs: Secondary | ICD-10-CM | POA: Diagnosis not present

## 2021-10-14 DIAGNOSIS — D120 Benign neoplasm of cecum: Secondary | ICD-10-CM | POA: Diagnosis not present

## 2021-10-14 DIAGNOSIS — K5730 Diverticulosis of large intestine without perforation or abscess without bleeding: Secondary | ICD-10-CM | POA: Diagnosis not present

## 2021-10-14 DIAGNOSIS — K319 Disease of stomach and duodenum, unspecified: Secondary | ICD-10-CM | POA: Diagnosis not present

## 2021-10-14 DIAGNOSIS — K635 Polyp of colon: Secondary | ICD-10-CM | POA: Diagnosis not present

## 2021-10-14 DIAGNOSIS — Z1211 Encounter for screening for malignant neoplasm of colon: Secondary | ICD-10-CM | POA: Diagnosis not present

## 2021-10-14 DIAGNOSIS — K2970 Gastritis, unspecified, without bleeding: Secondary | ICD-10-CM | POA: Diagnosis not present

## 2021-10-14 DIAGNOSIS — K31A11 Gastric intestinal metaplasia without dysplasia, involving the antrum: Secondary | ICD-10-CM | POA: Diagnosis not present

## 2021-10-14 DIAGNOSIS — K317 Polyp of stomach and duodenum: Secondary | ICD-10-CM | POA: Diagnosis not present

## 2021-10-14 DIAGNOSIS — D509 Iron deficiency anemia, unspecified: Secondary | ICD-10-CM | POA: Diagnosis not present

## 2021-10-31 DIAGNOSIS — Z0000 Encounter for general adult medical examination without abnormal findings: Secondary | ICD-10-CM | POA: Diagnosis not present

## 2021-10-31 DIAGNOSIS — E1140 Type 2 diabetes mellitus with diabetic neuropathy, unspecified: Secondary | ICD-10-CM | POA: Diagnosis not present

## 2021-10-31 DIAGNOSIS — E785 Hyperlipidemia, unspecified: Secondary | ICD-10-CM | POA: Diagnosis not present

## 2021-10-31 DIAGNOSIS — R7989 Other specified abnormal findings of blood chemistry: Secondary | ICD-10-CM | POA: Diagnosis not present

## 2021-11-05 DIAGNOSIS — H0520 Unspecified exophthalmos: Secondary | ICD-10-CM | POA: Diagnosis not present

## 2021-11-05 DIAGNOSIS — I739 Peripheral vascular disease, unspecified: Secondary | ICD-10-CM | POA: Diagnosis not present

## 2021-11-05 DIAGNOSIS — M5416 Radiculopathy, lumbar region: Secondary | ICD-10-CM | POA: Diagnosis not present

## 2021-11-05 DIAGNOSIS — R0600 Dyspnea, unspecified: Secondary | ICD-10-CM | POA: Diagnosis not present

## 2021-11-05 DIAGNOSIS — E042 Nontoxic multinodular goiter: Secondary | ICD-10-CM | POA: Diagnosis not present

## 2021-11-05 DIAGNOSIS — E785 Hyperlipidemia, unspecified: Secondary | ICD-10-CM | POA: Diagnosis not present

## 2021-11-05 DIAGNOSIS — K219 Gastro-esophageal reflux disease without esophagitis: Secondary | ICD-10-CM | POA: Diagnosis not present

## 2021-11-05 DIAGNOSIS — H9319 Tinnitus, unspecified ear: Secondary | ICD-10-CM | POA: Diagnosis not present

## 2021-11-05 DIAGNOSIS — N3010 Interstitial cystitis (chronic) without hematuria: Secondary | ICD-10-CM | POA: Diagnosis not present

## 2021-11-05 DIAGNOSIS — M25561 Pain in right knee: Secondary | ICD-10-CM | POA: Diagnosis not present

## 2021-11-05 DIAGNOSIS — E1140 Type 2 diabetes mellitus with diabetic neuropathy, unspecified: Secondary | ICD-10-CM | POA: Diagnosis not present

## 2021-11-10 NOTE — Progress Notes (Unsigned)
Synopsis: Referred for dyspnea by Reynold Bowen, MD  Subjective:   PATIENT ID: Hunter Bridges GENDER: male DOB: 1951-02-15, MRN: [id number]  No chief complaint on file.  17yM with history of biliary dyskinesia, GERD, rhinitis, chronic right hemidiaphragm elevation referred for   Otherwise pertinent review of systems is negative.  Past Medical History:  Diagnosis Date   Abdominal pain    Allergy    Anemia    hx   Anxiety    Arthritis    Diabetes mellitus without complication (HCC)    Enlarged prostate    Gallbladder disease    GERD (gastroesophageal reflux disease)    Hiatal hernia    Hyperlipidemia    Insomnia    Nausea alone    Nocturia    Pneumonia    hx     Family History  Problem Relation Age of Onset   Cancer Mother        colon     Past Surgical History:  Procedure Laterality Date   CARDIAC CATHETERIZATION  05   nml   CERVICAL [place] SURGERY  14   CHEST TUBE INSERTION  2006   DUE TO FX RIBS   CHOLECYSTECTOMY  01/12/2012   Procedure: LAPAROSCOPIC CHOLECYSTECTOMY;  Surgeon: Harl Bowie, MD;  Location: [facility];  Service: General;;   COLONOSCOPY  2013   EYE SURGERY  2012,15   strabismus,cataracts   MAXIMUM ACCESS (MAS)POSTERIOR LUMBAR INTERBODY FUSION (PLIF) 1 LEVEL N/A 11/23/2013   Procedure: LUMBAR FOUR-FIVE FOR MAXIMUM ACCESS (MAS) POSTERIOR LUMBAR INTERBODY FUSION (PLIF) ;  Surgeon: Eustace Moore, MD;  Location: [facility];  Service: Neurosurgery;  Laterality: N/A;   STRABISMUS SURGERY Left 06/23/2014   Procedure: REPAIR STRABISMUS LEFT EYE;  Surgeon: Everitt Amber, MD;  Location: [place];  Service: Ophthalmology;  Laterality: Left;   STRABISMUS SURGERY Right 10/20/2014   Procedure: REPAIR STRABISMUS RIGHT EYE;  Surgeon: Everitt Amber, MD;  Location: [place];  Service: Ophthalmology;  Laterality: Right;   TRANSURETHRAL RESECTION OF PROSTATE  2006    Social History   Socioeconomic History   Marital  status: Married    Spouse name: Not on file   Number of children: Not on file   Years of education: Not on file   Highest education level: Not on file  Occupational History   Not on file  Tobacco Use   Smoking status: Former    Packs/day: 0.25    Years: 10.00    Total pack years: 2.50    Types: Cigarettes    Quit date: 01/08/1984    Years since quitting: 37.8   Smokeless tobacco: Former    Types: Snuff    Quit date: 1980  Vaping Use   Vaping Use: Never used  Substance and Sexual Activity   Alcohol use: Yes    Comment: occ   Drug use: No   Sexual activity: Not on file  Other Topics Concern   Not on file  Social History Narrative   Not on file   Social Determinants of Health   Financial Resource Strain: Not on file  Food Insecurity: Not on file  Transportation Needs: Not on file  Physical Activity: Not on file  Stress: Not on file  Social Connections: Not on file  Intimate Partner Violence: Not on file     Allergies  Allergen Reactions   Penicillins Rash     Outpatient Medications Prior to Visit  Medication Sig Dispense Refill   acetaminophen (  TYLENOL) 500 MG tablet Take 500 mg by mouth every 6 (six) hours as needed for moderate pain.      albuterol (PROVENTIL HFA;VENTOLIN HFA) 108 (90 BASE) MCG/ACT inhaler Inhale 1 puff into the lungs every 6 (six) hours as needed for wheezing or shortness of breath.     ALPRAZolam (XANAX) 0.5 MG tablet Take 0.5 mg by mouth 2 (two) times daily as needed. Anxiety     cycloSPORINE (RESTASIS) 0.05 % ophthalmic emulsion 1 drop 2 (two) times daily.     DEXILANT 60 MG capsule Take 60 mg by mouth daily.      diclofenac sodium (VOLTAREN) 1 % GEL Apply 2 g topically 4 (four) times daily as needed (for pain).     linagliptin (TRADJENTA) 5 MG TABS tablet Take 5 mg by mouth daily.     loratadine (CLARITIN) 10 MG tablet Take 10 mg by mouth every morning.     meloxicam (MOBIC) 15 MG tablet Take 15 mg by mouth daily.      methocarbamol  (ROBAXIN) 500 MG tablet Take 1 tablet (500 mg total) by mouth every 8 (eight) hours as needed (for lower back and leg pain). 60 tablet 1   methylcellulose (ARTIFICIAL TEARS) 1 % ophthalmic solution Place 1 drop into both eyes 3 (three) times daily as needed. Dry eyes     mometasone (NASONEX) 50 MCG/ACT nasal spray Place 1 spray into both nostrils daily.     Multiple Vitamins-Minerals (CENTRUM SILVER PO) Take 1 tablet by mouth daily.     ondansetron (ZOFRAN) 8 MG tablet Take by mouth every 8 (eight) hours as needed for nausea or vomiting.     oxyCODONE-acetaminophen (PERCOCET) 7.5-325 MG per tablet Take 1 tablet by mouth every 4 (four) hours as needed. 10 tablet 0   oxyCODONE-acetaminophen (PERCOCET/ROXICET) 5-325 MG per tablet Take 1-2 tablets by mouth every 4 (four) hours as needed for moderate pain. 90 tablet 0   oxymetazoline (AFRIN) 0.05 % nasal spray Place 1 spray into both nostrils 2 (two) times daily as needed. Allergies     pentosan polysulfate (ELMIRON) 100 MG capsule Take 100 mg by mouth 4 (four) times daily.     phenylephrine (SUDAFED PE) 10 MG TABS tablet Take 10 mg by mouth every 4 (four) hours as needed (for congestion).     psyllium (REGULOID) 0.52 G capsule Take 1.56 g by mouth daily.      saxagliptin HCl (ONGLYZA) 2.5 MG TABS tablet Take 2.5 mg by mouth daily.     simvastatin (ZOCOR) 80 MG tablet Take 80 mg by mouth every morning.      solifenacin (VESICARE) 5 MG tablet Take 5 mg by mouth daily.     tobramycin-dexamethasone (TOBRADEX) ophthalmic ointment Place 1 application into the right eye 2 (two) times daily. 3.5 g 0   zolpidem (AMBIEN) 10 MG tablet Take 10 mg by mouth at bedtime.      ZUPLENZ 8 MG FILM Place 8 mg under the tongue 2 (two) times daily as needed. Nausea     No facility-administered medications prior to visit.       Objective:   Physical Exam:  General appearance: 71 y.o., male, NAD, conversant  Eyes: anicteric sclerae; PERRL, tracking  appropriately HENT: NCAT; MMM Neck: Trachea midline; no lymphadenopathy, no JVD Lungs: CTAB, no crackles, no wheeze, with normal respiratory effort CV: RRR, no murmur  Abdomen: Soft, non-tender; non-distended, BS present  Extremities: No peripheral edema, warm Skin: Normal turgor and texture; no rash Psych:  Appropriate affect Neuro: Alert and oriented to person and place, no focal deficit     There were no vitals filed for this visit.   on *** LPM *** RA BMI Readings from Last 3 Encounters:  08/04/16 21.44 kg/m  10/20/14 22.45 kg/m  06/23/14 21.76 kg/m   Wt Readings from Last 3 Encounters:  08/04/16 145 lb 3.2 oz (65.9 kg)  10/20/14 152 lb (68.9 kg)  06/23/14 149 lb 8 oz (67.8 kg)     CBC    Component Value Date/Time   WBC 4.0 11/11/2013 0859   RBC 4.49 11/11/2013 0859   HGB 15.0 10/20/2014 0829   HCT 44.0 10/20/2014 0829   PLT 180 11/11/2013 0859   MCV 86.0 11/11/2013 0859   MCH 28.3 11/11/2013 0859   MCHC 32.9 11/11/2013 0859   RDW 14.2 11/11/2013 0859   LYMPHSABS 0.9 11/11/2013 0859   MONOABS 0.4 11/11/2013 0859   EOSABS 0.1 11/11/2013 0859   BASOSABS 0.0 11/11/2013 0859    ***  Chest Imaging: ***  Pulmonary Functions Testing Results:     No data to display          FeNO: ***  Pathology: ***  Echocardiogram: ***  Heart Catheterization: ***    Assessment & Plan:    Plan:      Maryjane Hurter, MD [facility] 11/10/2021 2:28 PM

## 2021-11-12 ENCOUNTER — Ambulatory Visit (INDEPENDENT_AMBULATORY_CARE_PROVIDER_SITE_OTHER): Payer: PPO

## 2021-11-12 ENCOUNTER — Ambulatory Visit: Payer: PPO | Admitting: Student

## 2021-11-12 ENCOUNTER — Encounter: Payer: Self-pay | Admitting: Student

## 2021-11-12 VITALS — BP 130/80 | HR 92 | Ht 69.5 in | Wt 147.4 lb

## 2021-11-12 DIAGNOSIS — R0609 Other forms of dyspnea: Secondary | ICD-10-CM

## 2021-11-12 DIAGNOSIS — R0600 Dyspnea, unspecified: Secondary | ICD-10-CM | POA: Diagnosis not present

## 2021-11-12 LAB — BRAIN NATRIURETIC PEPTIDE: Pro B Natriuretic peptide (BNP): 74 pg/mL (ref 0.0–100.0)

## 2021-11-12 NOTE — Patient Instructions (Addendum)
-   Lab, x ray today - ok to continue spiriva, albuterol as needed for now - we will schedule PFTs and I'll see you in clinic right afterward. Would try to stop spiriva at least a few days before PFTs.

## 2021-11-14 DIAGNOSIS — K219 Gastro-esophageal reflux disease without esophagitis: Secondary | ICD-10-CM | POA: Diagnosis not present

## 2021-11-14 DIAGNOSIS — Z86010 Personal history of colonic polyps: Secondary | ICD-10-CM | POA: Diagnosis not present

## 2021-11-14 DIAGNOSIS — K449 Diaphragmatic hernia without obstruction or gangrene: Secondary | ICD-10-CM | POA: Diagnosis not present

## 2021-11-14 DIAGNOSIS — K5730 Diverticulosis of large intestine without perforation or abscess without bleeding: Secondary | ICD-10-CM | POA: Diagnosis not present

## 2021-11-26 DIAGNOSIS — M1712 Unilateral primary osteoarthritis, left knee: Secondary | ICD-10-CM | POA: Diagnosis not present

## 2021-11-26 DIAGNOSIS — M7061 Trochanteric bursitis, right hip: Secondary | ICD-10-CM | POA: Diagnosis not present

## 2021-11-26 DIAGNOSIS — M1711 Unilateral primary osteoarthritis, right knee: Secondary | ICD-10-CM | POA: Diagnosis not present

## 2021-11-26 DIAGNOSIS — M7062 Trochanteric bursitis, left hip: Secondary | ICD-10-CM | POA: Diagnosis not present

## 2021-11-26 DIAGNOSIS — M170 Bilateral primary osteoarthritis of knee: Secondary | ICD-10-CM | POA: Diagnosis not present

## 2021-12-17 DIAGNOSIS — M7061 Trochanteric bursitis, right hip: Secondary | ICD-10-CM | POA: Diagnosis not present

## 2021-12-17 DIAGNOSIS — M7062 Trochanteric bursitis, left hip: Secondary | ICD-10-CM | POA: Diagnosis not present

## 2022-01-14 NOTE — Progress Notes (Unsigned)
Synopsis: Referred for dyspnea by Reynold Bowen, MD  Subjective:   PATIENT ID: Hunter Bridges GENDER: male DOB: 1950/08/26, MRN: [id number]  No chief complaint on file.  71yM with history of biliary dyskinesia, GERD, rhinitis, ACDF 2014, chronic right hemidiaphragm elevation referred for dyspnea  Has been going on for a few years now. DOE with heavy yardwork, walking uphill. Maybe some bendopnea. He has no CP, just some left chest tightness which he thinks is MSK. He has no orthopnea. He has no significant LE edema.   Has been on spiriva for 6 months without significant effect. He thinks he notices very minor improvement when he uses albuterol.   His wife says he makes noises at night that sound like he's having trouble breathing, snores. He doesn't have excessive daytime sleepiness however.  Broke 3 ribs in 2006 and then had R hemothorax needing chest tube. Had syncopized and fell to his side at the time.   Says he had a stress test, LHC 20+ya normal  Smoked quarter pack daily for 10 years. 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 Had no respiratory issues at the time. No MJ/vaping.   Interval HPI  CXR stable  PFTs   Otherwise pertinent review of systems is negative.  Past Medical History:  Diagnosis Date   Abdominal pain    Allergy    Anemia    hx   Anxiety    Arthritis    Diabetes mellitus without complication (HCC)    Enlarged prostate    Gallbladder disease    GERD (gastroesophageal reflux disease)    Hiatal hernia    Hyperlipidemia    Insomnia    Nausea alone    Nocturia    Pneumonia    hx     Family History  Problem Relation Age of Onset   Cancer Mother        colon     Past Surgical History:  Procedure Laterality Date   CARDIAC CATHETERIZATION  05   nml   CERVICAL [place] SURGERY  14   CHEST TUBE INSERTION  2006   DUE TO FX RIBS   CHOLECYSTECTOMY  01/12/2012   Procedure:  LAPAROSCOPIC CHOLECYSTECTOMY;  Surgeon: Harl Bowie, MD;  Location: [facility];  Service: General;;   COLONOSCOPY  2013   EYE SURGERY  2012,15   strabismus,cataracts   MAXIMUM ACCESS (MAS)POSTERIOR LUMBAR INTERBODY FUSION (PLIF) 1 LEVEL N/A 11/23/2013   Procedure: LUMBAR FOUR-FIVE FOR MAXIMUM ACCESS (MAS) POSTERIOR LUMBAR INTERBODY FUSION (PLIF) ;  Surgeon: Eustace Moore, MD;  Location: [facility]UR[facility]S;  Service: Neurosurgery;  Laterality: N/A;   STRABISMUS SURGERY Left 06/23/2014   Procedure: REPAIR STRABISMUS LEFT EYE;  Surgeon: Everitt Amber, MD;  Location: [place];  Service: Ophthalmology;  Laterality: Left;   STRABISMUS SURGERY Right 10/20/2014   Procedure: REPAIR STRABISMUS RIGHT EYE;  Surgeon: Everitt Amber, MD;  Location: [place];  Service: Ophthalmology;  Laterality: Right;   TRANSURETHRAL RESECTION OF PROSTATE  2006    Social History   Socioeconomic History   Marital status: Married    Spouse name: Not on file   Number of children: Not on file   Years of education: Not on file   Highest education level: Not on file  Occupational History   Not on file  Tobacco Use   Smoking status: Former    Packs/day: 0.25    Years: 10.00    Total pack years:  2.50    Types: Cigarettes    Quit date: 01/08/1984    Years since quitting: 38.0   Smokeless tobacco: Former    Types: Snuff    Quit date: 1980  Vaping Use   Vaping Use: Never used  Substance and Sexual Activity   Alcohol use: Yes    Comment: occ   Drug use: No   Sexual activity: Not on file  Other Topics Concern   Not on file  Social History Narrative   Not on file   Social Determinants of Health   Financial Resource Strain: Not on file  Food Insecurity: Not on file  Transportation Needs: Not on file  Physical Activity: Not on file  Stress: Not on file  Social Connections: Not on file  Intimate Partner Violence: Not on file     Allergies  Allergen Reactions   Penicillins Rash      Outpatient Medications Prior to Visit  Medication Sig Dispense Refill   acetaminophen (TYLENOL) 500 MG tablet Take 500 mg by mouth every 6 (six) hours as needed for moderate pain.      albuterol (PROVENTIL HFA;VENTOLIN HFA) 108 (90 BASE) MCG/ACT inhaler Inhale 1 puff into the lungs every 6 (six) hours as needed for wheezing or shortness of breath.     ALPRAZolam (XANAX) 0.5 MG tablet Take 0.5 mg by mouth 2 (two) times daily as needed. Anxiety     cycloSPORINE (RESTASIS) 0.05 % ophthalmic emulsion 1 drop 2 (two) times daily.     DEXILANT 60 MG capsule Take 60 mg by mouth daily.      diclofenac sodium (VOLTAREN) 1 % GEL Apply 2 g topically 4 (four) times daily as needed (for pain).     linagliptin (TRADJENTA) 5 MG TABS tablet Take 5 mg by mouth daily.     loratadine (CLARITIN) 10 MG tablet Take 10 mg by mouth every morning.     meloxicam (MOBIC) 15 MG tablet Take 15 mg by mouth daily.      methocarbamol (ROBAXIN) 500 MG tablet Take 1 tablet (500 mg total) by mouth every 8 (eight) hours as needed (for lower back and leg pain). 60 tablet 1   methylcellulose (ARTIFICIAL TEARS) 1 % ophthalmic solution Place 1 drop into both eyes 3 (three) times daily as needed. Dry eyes     mometasone (NASONEX) 50 MCG/ACT nasal spray Place 1 spray into both nostrils daily.     Multiple Vitamins-Minerals (CENTRUM SILVER PO) Take 1 tablet by mouth daily.     ondansetron (ZOFRAN) 8 MG tablet Take by mouth every 8 (eight) hours as needed for nausea or vomiting.     oxyCODONE-acetaminophen (PERCOCET/ROXICET) 5-325 MG per tablet Take 1-2 tablets by mouth every 4 (four) hours as needed for moderate pain. 90 tablet 0   oxymetazoline (AFRIN) 0.05 % nasal spray Place 1 spray into both nostrils 2 (two) times daily as needed. Allergies     pentosan polysulfate (ELMIRON) 100 MG capsule Take 100 mg by mouth 4 (four) times daily.     phenylephrine (SUDAFED PE) 10 MG TABS tablet Take 10 mg by mouth every 4 (four) hours as needed  (for congestion).     psyllium (REGULOID) 0.52 G capsule Take 1.56 g by mouth daily.      saxagliptin HCl (ONGLYZA) 2.5 MG TABS tablet Take 2.5 mg by mouth daily.     simvastatin (ZOCOR) 80 MG tablet Take 80 mg by mouth every morning.      solifenacin (VESICARE) 5 MG  tablet Take 5 mg by mouth daily.     SPIRIVA RESPIMAT 1.25 MCG/ACT AERS Inhale 1.25 mcg into the lungs daily.     tobramycin-dexamethasone (TOBRADEX) ophthalmic ointment Place 1 application into the right eye 2 (two) times daily. 3.5 g 0   zolpidem (AMBIEN) 10 MG tablet Take 10 mg by mouth at bedtime.      ZUPLENZ 8 MG FILM Place 8 mg under the tongue 2 (two) times daily as needed. Nausea     No facility-administered medications prior to visit.       Objective:   Physical Exam:  General appearance: 71 y.o., male, NAD, conversant  Eyes: anicteric sclerae; PERRL, tracking appropriately HENT: NCAT; MMM Neck: Trachea midline; no lymphadenopathy, no JVD Lungs: CTAB, no crackles, no wheeze, with normal respiratory effort CV: RRR, no murmur  Abdomen: Soft, non-tender; non-distended, BS present  Extremities: No peripheral edema, warm Skin: Normal turgor and texture; no rash Psych: Appropriate affect Neuro: Alert and oriented to person and place, no focal deficit     There were no vitals filed for this visit.    on RA BMI Readings from Last 3 Encounters:  11/12/21 21.46 kg/m  08/04/16 21.44 kg/m  10/20/14 22.45 kg/m   Wt Readings from Last 3 Encounters:  11/12/21 147 lb 6.4 oz (66.9 kg)  08/04/16 145 lb 3.2 oz (65.9 kg)  10/20/14 152 lb (68.9 kg)     CBC    Component Value Date/Time   WBC 4.0 11/11/2013 0859   RBC 4.49 11/11/2013 0859   HGB 15.0 10/20/2014 0829   HCT 44.0 10/20/2014 0829   PLT 180 11/11/2013 0859   MCV 86.0 11/11/2013 0859   MCH 28.3 11/11/2013 0859   MCHC 32.9 11/11/2013 0859   RDW 14.2 11/11/2013 0859   LYMPHSABS 0.9 11/11/2013 0859   MONOABS 0.4 11/11/2013 0859   EOSABS 0.1  11/11/2013 0859   BASOSABS 0.0 11/11/2013 0859    Chest Imaging: CXR 2015 reviewed by me with elevated R hemidiaphragm  CXR 11/12/21 stable r hemidiaphragm elevation  Pulmonary Functions Testing Results:     No data to display             Assessment & Plan:   # DOE  Unclear etiology. Limited smoking history. Absence of episodic issues decreases suspicion somewhat for asthma, copd but these are possible. Hasn't had any recent cardiac eval. R hemidiaphragm elevated 2015 which could be related to ACDF or remote hemothorax (although there isn't clearly plaque on CXR then).  Plan: - BNP, x ray today - ok to continue spiriva, albuterol as needed for now - we will schedule PFTs and I'll see you in clinic right afterward. Would try to stop spiriva at least a few days before PFTs.      Maryjane Hurter, MD [facility] 01/14/2022 5:33 PM

## 2022-01-15 ENCOUNTER — Other Ambulatory Visit (HOSPITAL_COMMUNITY): Payer: Self-pay

## 2022-01-15 ENCOUNTER — Ambulatory Visit (INDEPENDENT_AMBULATORY_CARE_PROVIDER_SITE_OTHER): Payer: PPO | Admitting: Student

## 2022-01-15 ENCOUNTER — Encounter: Payer: Self-pay | Admitting: Student

## 2022-01-15 ENCOUNTER — Telehealth: Payer: Self-pay | Admitting: Student

## 2022-01-15 VITALS — BP 150/80 | HR 104 | Temp 98.3°F | Ht 69.0 in | Wt 150.4 lb

## 2022-01-15 DIAGNOSIS — R0609 Other forms of dyspnea: Secondary | ICD-10-CM | POA: Diagnosis not present

## 2022-01-15 DIAGNOSIS — J45998 Other asthma: Secondary | ICD-10-CM | POA: Diagnosis not present

## 2022-01-15 LAB — PULMONARY FUNCTION TEST
DL/VA % pred: 101 %
DL/VA: 4.08 ml/min/mmHg/L
DLCO cor % pred: 84 %
DLCO cor: 21.06 ml/min/mmHg
DLCO unc % pred: 84 %
DLCO unc: 21.06 ml/min/mmHg
FEF 25-75 Post: 2.24 L/sec
FEF 25-75 Pre: 1.23 L/sec
FEF2575-%Change-Post: 82 %
FEF2575-%Pred-Post: 98 %
FEF2575-%Pred-Pre: 54 %
FEV1-%Change-Post: 31 %
FEV1-%Pred-Post: 90 %
FEV1-%Pred-Pre: 69 %
FEV1-Post: 2.79 L
FEV1-Pre: 2.13 L
FEV1FVC-%Change-Post: 28 %
FEV1FVC-%Pred-Pre: 79 %
FEV6-%Change-Post: 4 %
FEV6-%Pred-Post: 94 %
FEV6-%Pred-Pre: 89 %
FEV6-Post: 3.74 L
FEV6-Pre: 3.58 L
FEV6FVC-%Change-Post: 3 %
FEV6FVC-%Pred-Post: 106 %
FEV6FVC-%Pred-Pre: 103 %
FVC-%Change-Post: 2 %
FVC-%Pred-Post: 89 %
FVC-%Pred-Pre: 87 %
FVC-Post: 3.77 L
FVC-Pre: 3.69 L
Post FEV1/FVC ratio: 74 %
Post FEV6/FVC ratio: 100 %
Pre FEV1/FVC ratio: 58 %
Pre FEV6/FVC Ratio: 97 %
RV % pred: 101 %
RV: 2.53 L
TLC % pred: 85 %
TLC: 5.94 L

## 2022-01-15 NOTE — Patient Instructions (Signed)
-   Message sent to pharmacy to see what asthma inhaler is least expensive, I'll send you message once I find out and prescribe it - albuterol 1-2 puffs as needed - this is your rescue inhaler - see you in clinic in 3 months or sooner if need be!

## 2022-01-15 NOTE — Progress Notes (Signed)
Full PFT completed  

## 2022-01-15 NOTE — Telephone Encounter (Signed)
Per benefits investigation the cheapest ICS/LABA is Advair Diskus at a $6.96 co-pay.

## 2022-01-15 NOTE — Telephone Encounter (Signed)
What is least expensive LABA/ICS for him?  Thanks!

## 2022-01-17 MED ORDER — FLUTICASONE-SALMETEROL 250-50 MCG/ACT IN AEPB: 1.0000 | INHALATION_SPRAY | Freq: Two times a day (BID) | RESPIRATORY_TRACT | 11 refills | Status: DC

## 2022-01-17 NOTE — Telephone Encounter (Signed)
Mychart message sent.

## 2022-01-21 DIAGNOSIS — M5441 Lumbago with sciatica, right side: Secondary | ICD-10-CM | POA: Diagnosis not present

## 2022-01-21 DIAGNOSIS — M5442 Lumbago with sciatica, left side: Secondary | ICD-10-CM | POA: Diagnosis not present

## 2022-02-06 ENCOUNTER — Other Ambulatory Visit: Payer: Self-pay | Admitting: Orthopedic Surgery

## 2022-02-06 DIAGNOSIS — M545 Low back pain, unspecified: Secondary | ICD-10-CM

## 2022-02-20 ENCOUNTER — Ambulatory Visit
Admission: RE | Admit: 2022-02-20 | Discharge: 2022-02-20 | Disposition: A | Discharge status: Home / Self Care | Payer: PPO | Source: Ambulatory Visit | Attending: Orthopedic Surgery | Admitting: Orthopedic Surgery

## 2022-02-20 DIAGNOSIS — M545 Low back pain, unspecified: Secondary | ICD-10-CM

## 2022-02-20 DIAGNOSIS — M48061 Spinal stenosis, lumbar region without neurogenic claudication: Secondary | ICD-10-CM | POA: Diagnosis not present

## 2022-03-03 HISTORY — PX: COLONOSCOPY: SHX174

## 2022-03-06 DIAGNOSIS — M545 Low back pain, unspecified: Secondary | ICD-10-CM | POA: Diagnosis not present

## 2022-03-07 DIAGNOSIS — M5416 Radiculopathy, lumbar region: Secondary | ICD-10-CM | POA: Diagnosis not present

## 2022-03-25 DIAGNOSIS — M5416 Radiculopathy, lumbar region: Secondary | ICD-10-CM | POA: Diagnosis not present

## 2022-04-21 DIAGNOSIS — M48062 Spinal stenosis, lumbar region with neurogenic claudication: Secondary | ICD-10-CM | POA: Diagnosis not present

## 2022-05-06 DIAGNOSIS — E1140 Type 2 diabetes mellitus with diabetic neuropathy, unspecified: Secondary | ICD-10-CM | POA: Diagnosis not present

## 2022-05-06 DIAGNOSIS — Z125 Encounter for screening for malignant neoplasm of prostate: Secondary | ICD-10-CM | POA: Diagnosis not present

## 2022-05-06 DIAGNOSIS — D508 Other iron deficiency anemias: Secondary | ICD-10-CM | POA: Diagnosis not present

## 2022-05-06 DIAGNOSIS — E042 Nontoxic multinodular goiter: Secondary | ICD-10-CM | POA: Diagnosis not present

## 2022-05-06 DIAGNOSIS — E785 Hyperlipidemia, unspecified: Secondary | ICD-10-CM | POA: Diagnosis not present

## 2022-05-06 DIAGNOSIS — K219 Gastro-esophageal reflux disease without esophagitis: Secondary | ICD-10-CM | POA: Diagnosis not present

## 2022-05-06 DIAGNOSIS — R7989 Other specified abnormal findings of blood chemistry: Secondary | ICD-10-CM | POA: Diagnosis not present

## 2022-05-06 DIAGNOSIS — Z1212 Encounter for screening for malignant neoplasm of rectum: Secondary | ICD-10-CM | POA: Diagnosis not present

## 2022-05-13 DIAGNOSIS — R82998 Other abnormal findings in urine: Secondary | ICD-10-CM | POA: Diagnosis not present

## 2022-05-13 DIAGNOSIS — Z1331 Encounter for screening for depression: Secondary | ICD-10-CM | POA: Diagnosis not present

## 2022-05-13 DIAGNOSIS — M5416 Radiculopathy, lumbar region: Secondary | ICD-10-CM | POA: Diagnosis not present

## 2022-05-13 DIAGNOSIS — R0600 Dyspnea, unspecified: Secondary | ICD-10-CM | POA: Diagnosis not present

## 2022-05-13 DIAGNOSIS — E871 Hypo-osmolality and hyponatremia: Secondary | ICD-10-CM | POA: Diagnosis not present

## 2022-05-13 DIAGNOSIS — G8929 Other chronic pain: Secondary | ICD-10-CM | POA: Diagnosis not present

## 2022-05-13 DIAGNOSIS — H0520 Unspecified exophthalmos: Secondary | ICD-10-CM | POA: Diagnosis not present

## 2022-05-13 DIAGNOSIS — Z1339 Encounter for screening examination for other mental health and behavioral disorders: Secondary | ICD-10-CM | POA: Diagnosis not present

## 2022-05-13 DIAGNOSIS — H9319 Tinnitus, unspecified ear: Secondary | ICD-10-CM | POA: Diagnosis not present

## 2022-05-13 DIAGNOSIS — N3010 Interstitial cystitis (chronic) without hematuria: Secondary | ICD-10-CM | POA: Diagnosis not present

## 2022-05-13 DIAGNOSIS — Z0000 Encounter for general adult medical examination without abnormal findings: Secondary | ICD-10-CM | POA: Diagnosis not present

## 2022-05-13 DIAGNOSIS — E1140 Type 2 diabetes mellitus with diabetic neuropathy, unspecified: Secondary | ICD-10-CM | POA: Diagnosis not present

## 2022-05-13 DIAGNOSIS — E785 Hyperlipidemia, unspecified: Secondary | ICD-10-CM | POA: Diagnosis not present

## 2022-05-29 DIAGNOSIS — R350 Frequency of micturition: Secondary | ICD-10-CM | POA: Diagnosis not present

## 2022-05-29 DIAGNOSIS — R3915 Urgency of urination: Secondary | ICD-10-CM | POA: Diagnosis not present

## 2022-06-01 NOTE — Progress Notes (Unsigned)
Synopsis: Referred for dyspnea by Reynold Bowen, MD  Subjective:   PATIENT ID: Hunter Bridges GENDER: male DOB: [birth year], MRN: [id number]  No chief complaint on file.  72yM with history of biliary dyskinesia, GERD, rhinitis, ACDF 2014, chronic right hemidiaphragm elevation referred for dyspnea  Has been going on for a few years now. DOE with heavy yardwork, walking uphill. Maybe some bendopnea. He has no CP, just some left chest tightness which he thinks is MSK. He has no orthopnea. He has no significant LE edema.   Has been on spiriva for 6 months without significant effect. He thinks he notices very minor improvement when he uses albuterol.   His wife says he makes noises at night that sound like he's having trouble breathing, snores. He doesn't have excessive daytime sleepiness however.  Broke 3 ribs in 2006 and then had R hemothorax needing chest tube. Had syncopized and fell to his side at the time.   Says he had a stress test, LHC 20+ya normal  Smoked quarter pack daily for 10 years. 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 Had no respiratory issues at the time. No MJ/vaping.   Interval HPI  Didn't get much response to albuterol.   CXR stable  PFTs with reversible mild obstruction and excellent BD response.   He has no cough. Similar DOE to last visit.  -------------------------------------------------- Advair 250 1 puff BID sent it after last appointment,   Otherwise pertinent review of systems is negative.  Past Medical History:  Diagnosis Date   Abdominal pain    Allergy    Anemia    hx   Anxiety    Arthritis    Diabetes mellitus without complication (HCC)    Enlarged prostate    Gallbladder disease    GERD (gastroesophageal reflux disease)    Hiatal hernia    Hyperlipidemia    Insomnia    Nausea alone    Nocturia    Pneumonia    hx     Family History  Problem Relation Age of  Onset   Cancer Mother        colon     Past Surgical History:  Procedure Laterality Date   CARDIAC CATHETERIZATION  05   nml   CERVICAL [place] SURGERY  14   CHEST TUBE INSERTION  2006   DUE TO FX RIBS   CHOLECYSTECTOMY  01/12/2012   Procedure: LAPAROSCOPIC CHOLECYSTECTOMY;  Surgeon: Harl Bowie, MD;  Location: [facility];  Service: General;;   COLONOSCOPY  2013   EYE SURGERY  2012,15   strabismus,cataracts   MAXIMUM ACCESS (MAS)POSTERIOR LUMBAR INTERBODY FUSION (PLIF) 1 LEVEL N/A 11/23/2013   Procedure: LUMBAR FOUR-FIVE FOR MAXIMUM ACCESS (MAS) POSTERIOR LUMBAR INTERBODY FUSION (PLIF) ;  Surgeon: Eustace Moore, MD;  Location: [facility]O [facility];  Service: Neurosurgery;  Laterality: N/A;   STRABISMUS SURGERY Left 06/23/2014   Procedure: REPAIR STRABISMUS LEFT EYE;  Surgeon: Everitt Amber, MD;  Location: [place];  Service: Ophthalmology;  Laterality: Left;   STRABISMUS SURGERY Right 10/20/2014   Procedure: REPAIR STRABISMUS RIGHT EYE;  Surgeon: Everitt Amber, MD;  Location: [place];  Service: Ophthalmology;  Laterality: Right;   TRANSURETHRAL RESECTION OF PROSTATE  2006    Social History   Socioeconomic History   Marital status: Married    Spouse name: Not on file   Number of children: Not on file   Years of education: Not on file  Highest education level: Not on file  Occupational History   Not on file  Tobacco Use   Smoking status: Former    Packs/day: 0.25    Years: 10.00    Additional pack years: 0.00    Total pack years: 2.50    Types: Cigarettes    Quit date: 01/08/1984    Years since quitting: 38.4   Smokeless tobacco: Former    Types: Snuff    Quit date: 1980  Vaping Use   Vaping Use: Never used  Substance and Sexual Activity   Alcohol use: Yes    Comment: occ   Drug use: No   Sexual activity: Not on file  Other Topics Concern   Not on file  Social History Narrative   Not on file   Social Determinants of Health    Financial Resource Strain: Not on file  Food Insecurity: Not on file  Transportation Needs: Not on file  Physical Activity: Not on file  Stress: Not on file  Social Connections: Not on file  Intimate Partner Violence: Not on file     Allergies  Allergen Reactions   Penicillins Rash     Outpatient Medications Prior to Visit  Medication Sig Dispense Refill   acetaminophen (TYLENOL) 500 MG tablet Take 500 mg by mouth every 6 (six) hours as needed for moderate pain.      albuterol (PROVENTIL HFA;VENTOLIN HFA) 108 (90 BASE) MCG/ACT inhaler Inhale 1 puff into the lungs every 6 (six) hours as needed for wheezing or shortness of breath.     ALPRAZolam (XANAX) 0.5 MG tablet Take 0.5 mg by mouth 2 (two) times daily as needed. Anxiety     cycloSPORINE (RESTASIS) 0.05 % ophthalmic emulsion 1 drop 2 (two) times daily.     DEXILANT 60 MG capsule Take 60 mg by mouth daily.      diclofenac sodium (VOLTAREN) 1 % GEL Apply 2 g topically 4 (four) times daily as needed (for pain).     fluticasone-salmeterol (ADVAIR DISKUS) 250-50 MCG/ACT AEPB Inhale 1 puff into the lungs in the morning and at bedtime. 1 each 11   linagliptin (TRADJENTA) 5 MG TABS tablet Take 5 mg by mouth daily.     loratadine (CLARITIN) 10 MG tablet Take 10 mg by mouth every morning.     meloxicam (MOBIC) 15 MG tablet Take 15 mg by mouth daily.      methocarbamol (ROBAXIN) 500 MG tablet Take 1 tablet (500 mg total) by mouth every 8 (eight) hours as needed (for lower back and leg pain). 60 tablet 1   methylcellulose (ARTIFICIAL TEARS) 1 % ophthalmic solution Place 1 drop into both eyes 3 (three) times daily as needed. Dry eyes     mometasone (NASONEX) 50 MCG/ACT nasal spray Place 1 spray into both nostrils daily.     Multiple Vitamins-Minerals (CENTRUM SILVER PO) Take 1 tablet by mouth daily.     ondansetron (ZOFRAN) 8 MG tablet Take by mouth every 8 (eight) hours as needed for nausea or vomiting.     oxyCODONE-acetaminophen  (PERCOCET/ROXICET) 5-325 MG per tablet Take 1-2 tablets by mouth every 4 (four) hours as needed for moderate pain. 90 tablet 0   oxymetazoline (AFRIN) 0.05 % nasal spray Place 1 spray into both nostrils 2 (two) times daily as needed. Allergies     pentosan polysulfate (ELMIRON) 100 MG capsule Take 100 mg by mouth 4 (four) times daily.     phenylephrine (SUDAFED PE) 10 MG TABS tablet Take 10 mg  by mouth every 4 (four) hours as needed (for congestion).     psyllium (REGULOID) 0.52 G capsule Take 1.56 g by mouth daily.      saxagliptin HCl (ONGLYZA) 2.5 MG TABS tablet Take 2.5 mg by mouth daily.     simvastatin (ZOCOR) 80 MG tablet Take 80 mg by mouth every morning.      solifenacin (VESICARE) 5 MG tablet Take 5 mg by mouth daily.     SPIRIVA RESPIMAT 1.25 MCG/ACT AERS Inhale 1.25 mcg into the lungs daily.     tobramycin-dexamethasone (TOBRADEX) ophthalmic ointment Place 1 application into the right eye 2 (two) times daily. 3.5 g 0   zolpidem (AMBIEN) 10 MG tablet Take 10 mg by mouth at bedtime.      ZUPLENZ 8 MG FILM Place 8 mg under the tongue 2 (two) times daily as needed. Nausea (Patient not taking: Reported on 01/15/2022)     No facility-administered medications prior to visit.       Objective:   Physical Exam:  General appearance: 72 y.o., male, NAD, conversant  Eyes: anicteric sclerae; PERRL, tracking appropriately HENT: NCAT; MMM Neck: Trachea midline; no lymphadenopathy, no JVD Lungs: CTAB, no crackles, no wheeze, with normal respiratory effort CV: RRR, no murmur  Abdomen: Soft, non-tender; non-distended, BS present  Extremities: No peripheral edema, warm Skin: Normal turgor and texture; no rash Psych: Appropriate affect Neuro: Alert and oriented to person and place, no focal deficit     There were no vitals filed for this visit.     on RA BMI Readings from Last 3 Encounters:  01/15/22 22.21 kg/m  11/12/21 21.46 kg/m  08/04/16 21.44 kg/m   Wt Readings from Last  3 Encounters:  01/15/22 150 lb 6.4 oz (68.2 kg)  11/12/21 147 lb 6.4 oz (66.9 kg)  08/04/16 145 lb 3.2 oz (65.9 kg)     CBC    Component Value Date/Time   WBC 4.0 11/11/2013 0859   RBC 4.49 11/11/2013 0859   HGB 15.0 10/20/2014 0829   HCT 44.0 10/20/2014 0829   PLT 180 11/11/2013 0859   MCV 86.0 11/11/2013 0859   MCH 28.3 11/11/2013 0859   MCHC 32.9 11/11/2013 0859   RDW 14.2 11/11/2013 0859   LYMPHSABS 0.9 11/11/2013 0859   MONOABS 0.4 11/11/2013 0859   EOSABS 0.1 11/11/2013 0859   BASOSABS 0.0 11/11/2013 0859    Chest Imaging: CXR 2015 reviewed by me with elevated R hemidiaphragm  CXR 11/12/21 stable r hemidiaphragm elevation  Pulmonary Functions Testing Results:    Latest Ref Rng & Units 01/15/2022    2:03 PM  PFT Results  FVC-Pre L 3.69   FVC-Predicted Pre % 87   FVC-Post L 3.77   FVC-Predicted Post % 89   Pre FEV1/FVC % % 58   Post FEV1/FCV % % 74   FEV1-Pre L 2.13   FEV1-Predicted Pre % 69   FEV1-Post L 2.79   DLCO uncorrected ml/min/mmHg 21.06   DLCO UNC% % 84   DLCO corrected ml/min/mmHg 21.06   DLCO COR %Predicted % 84   DLVA Predicted % 101   TLC L 5.94   TLC % Predicted % 85   RV % Predicted % 101        Assessment & Plan:   # DOE  # Possible uncontrolled asthma  # reversible mild obstruction with excellent bronchodilator response Hasn't had any recent cardiac eval. R hemidiaphragm elevated 2015 which could be related to ACDF or remote hemothorax (although there  isn't clearly plaque on CXR then).  Plan: - will check with pharmacy to see which LABA/ICS inhaler is least affordable - albuterol rescue/pre-exercise - we will schedule PFTs and I'll see you in clinic right afterward. Would try to stop spiriva at least a few days before PFTs   RTC 3 months   Maryjane Hurter, MD [place] [facility] 06/01/2022 4:44 PM

## 2022-06-03 ENCOUNTER — Ambulatory Visit (INDEPENDENT_AMBULATORY_CARE_PROVIDER_SITE_OTHER): Payer: PPO

## 2022-06-03 ENCOUNTER — Encounter: Payer: Self-pay | Admitting: Student

## 2022-06-03 ENCOUNTER — Ambulatory Visit (INDEPENDENT_AMBULATORY_CARE_PROVIDER_SITE_OTHER): Payer: PPO | Admitting: Student

## 2022-06-03 VITALS — BP 130/72 | HR 90 | Temp 98.6°F | Ht 69.0 in | Wt 156.6 lb

## 2022-06-03 DIAGNOSIS — R053 Chronic cough: Secondary | ICD-10-CM

## 2022-06-03 DIAGNOSIS — R059 Cough, unspecified: Secondary | ICD-10-CM | POA: Diagnosis not present

## 2022-06-03 DIAGNOSIS — R0609 Other forms of dyspnea: Secondary | ICD-10-CM

## 2022-06-03 MED ORDER — NYSTATIN 100000 UNIT/ML MT SUSP: 5.0000 mL | Freq: Four times a day (QID) | OROMUCOSAL | 0 refills | Status: AC

## 2022-06-03 NOTE — Patient Instructions (Addendum)
-   CXR today - Message sent to pharmacy to see what non-DPI asthma inhaler is least expensive, I'll send you message once I find out and prescribe it - try nystatin swish/swallow up to 4 times daily in case this is candida laryngitis - would stop wixela for now until we see which alternative inhaler we can go with and then start new inhaler after 1-2 weeks of taking nystatin - albuterol 1-2 puffs as needed - this is your rescue inhaler - see you in clinic in 2 months or sooner if need be!

## 2022-06-25 ENCOUNTER — Telehealth: Payer: Self-pay | Admitting: Student

## 2022-06-25 NOTE — Telephone Encounter (Signed)
Pt called the office stating that he thought Dr. Thora Lance was going to switch him from his current inhaler of Wixela to a different inhaler that was not powder due to it looking like he was getting yeast in his vocal cords.  Dr. Thora Lance, please advise on this for pt.  [facility] [place], [place].

## 2022-06-25 NOTE — Telephone Encounter (Signed)
What is least expensive non-DPI laba/ics inhaler for him?  Thanks!

## 2022-06-26 ENCOUNTER — Other Ambulatory Visit (HOSPITAL_COMMUNITY): Payer: Self-pay

## 2022-06-26 NOTE — Telephone Encounter (Signed)
Can we start PA process for symbicort 80 2 puff BID? Has tried and failed DPI inhalers with cough and thrush. Prefer that he would be able to use HFA style inhaler with spacer.  Thanks!!

## 2022-06-26 NOTE — Telephone Encounter (Signed)
Test claims for ICS+LABA Non DPI result in the following:   Dulera: Requires PA Symbicort: Requires PA

## 2022-06-27 ENCOUNTER — Other Ambulatory Visit (HOSPITAL_COMMUNITY): Payer: Self-pay

## 2022-06-27 ENCOUNTER — Telehealth: Payer: Self-pay

## 2022-06-27 NOTE — Telephone Encounter (Signed)
PA has been submitted and is pending determination, will update in additional encounter created.  

## 2022-06-27 NOTE — Telephone Encounter (Signed)
PA request received  via provider for Symbicort 80-4.5MCG/ACT aerosol  PA has been submitted to [employer] and is pending determination.  Key: [id number]

## 2022-06-27 NOTE — Telephone Encounter (Signed)
Please start PA for Symbicort as requested to Dr Thora Lance below. Thank you!

## 2022-07-10 ENCOUNTER — Other Ambulatory Visit (HOSPITAL_COMMUNITY): Payer: Self-pay

## 2022-07-10 DIAGNOSIS — R351 Nocturia: Secondary | ICD-10-CM | POA: Diagnosis not present

## 2022-07-10 DIAGNOSIS — D414 Neoplasm of uncertain behavior of bladder: Secondary | ICD-10-CM | POA: Diagnosis not present

## 2022-07-10 DIAGNOSIS — R3915 Urgency of urination: Secondary | ICD-10-CM | POA: Diagnosis not present

## 2022-07-10 DIAGNOSIS — N401 Enlarged prostate with lower urinary tract symptoms: Secondary | ICD-10-CM | POA: Diagnosis not present

## 2022-07-10 NOTE — Telephone Encounter (Signed)
Denial for received, appeal information filled out and faxed back to insurance for redetermination. /copy of PA form attached in patients documents

## 2022-07-10 NOTE — Telephone Encounter (Signed)
Insurance called:  PA has been denied due to patient not trying formulary alternatives.  They are re-faxing letter to PA [facility]  *patient has issues with thrush from alternatives   Will submit appeal when received

## 2022-07-11 ENCOUNTER — Telehealth: Payer: Self-pay | Admitting: Student

## 2022-07-11 NOTE — Telephone Encounter (Signed)
Dr. Thora Lance can you please advise on Xray results

## 2022-07-11 NOTE — Telephone Encounter (Signed)
Patient would like results of xray. Patient phone number is [phone] and [phone].

## 2022-07-23 ENCOUNTER — Telehealth: Payer: Self-pay | Admitting: Student

## 2022-07-23 NOTE — Telephone Encounter (Signed)
I spoke with the patient. I told him as of 07/10/2022 the PA Team has sent an appeal but has not heard anything back from his insurance company.  PA team can you give an update on the Symbicort? Thank you!

## 2022-07-23 NOTE — Telephone Encounter (Signed)
Patient checking on the RX for Symbicort. Pharmacy is [facility]. Patient phone number is [phone].

## 2022-07-24 ENCOUNTER — Other Ambulatory Visit (HOSPITAL_COMMUNITY): Payer: Self-pay

## 2022-07-24 NOTE — Telephone Encounter (Signed)
Second appeal request submitted over the phone, pending determination  Case# [id number]

## 2022-07-29 NOTE — Progress Notes (Unsigned)
Synopsis: Referred for dyspnea by Adrian Prince, MD  Subjective:   PATIENT ID: Hunter Bridges GENDER: male DOB: 05-03-1950, MRN: [id number]  No chief complaint on file.  71yM with history of biliary dyskinesia, GERD, rhinitis, ACDF 2014, chronic right hemidiaphragm elevation referred for dyspnea  Has been going on for a few years now. DOE with heavy yardwork, walking uphill. Maybe some bendopnea. He has no CP, just some left chest tightness which he thinks is MSK. He has no orthopnea. He has no significant LE edema.   Has been on spiriva for 6 months without significant effect. He thinks he notices very minor improvement when he uses albuterol.   His wife says he makes noises at night that sound like he's having trouble breathing, snores. He doesn't have excessive daytime sleepiness however.  Broke 3 ribs in 2006 and then had R hemothorax needing chest tube. Had syncopized and fell to his side at the time.   Says he had a stress test, LHC 20+ya normal  Smoked quarter pack daily for 10 years. 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 Had no respiratory issues at the time. No MJ/vaping.   Interval HPI Wixela 250 1 puff BID sent it after last appointment - does seem helpful. But has had productive cough over last several months. No sinus congestion, postnasal drainage. No fever. No overt reflux.  -------------------------- Working on [employer]. Given nystatin swish/swallow for possible candida laryngitis last visit.   Otherwise pertinent review of systems is negative.  Past Medical History:  Diagnosis Date   Abdominal pain    Allergy    Anemia    hx   Anxiety    Arthritis    Diabetes mellitus without complication (HCC)    Enlarged prostate    Gallbladder disease    GERD (gastroesophageal reflux disease)    Hiatal hernia    Hyperlipidemia    Insomnia    Nausea alone    Nocturia    Pneumonia    hx      Family History  Problem Relation Age of Onset   Cancer Mother        colon     Past Surgical History:  Procedure Laterality Date   CARDIAC CATHETERIZATION  05   nml   CERVICAL [place] SURGERY  14   CHEST TUBE INSERTION  2006   DUE TO FX RIBS   CHOLECYSTECTOMY  01/12/2012   Procedure: LAPAROSCOPIC CHOLECYSTECTOMY;  Surgeon: Shelly Rubenstein, MD;  Location: [facility];  Service: General;;   COLONOSCOPY  2013   EYE SURGERY  2012,15   strabismus,cataracts   MAXIMUM ACCESS (MAS)POSTERIOR LUMBAR INTERBODY FUSION (PLIF) 1 LEVEL N/A 11/23/2013   Procedure: LUMBAR FOUR-FIVE FOR MAXIMUM ACCESS (MAS) POSTERIOR LUMBAR INTERBODY FUSION (PLIF) ;  Surgeon: Tia Alert, MD;  Location: [facility];  Service: Neurosurgery;  Laterality: N/A;   STRABISMUS SURGERY Left 06/23/2014   Procedure: REPAIR STRABISMUS LEFT EYE;  Surgeon: Verne Carrow, MD;  Location: [facility];  Service: Ophthalmology;  Laterality: Left;   STRABISMUS SURGERY Right 10/20/2014   Procedure: REPAIR STRABISMUS RIGHT EYE;  Surgeon: Verne Carrow, MD;  Location: [facility];  Service: Ophthalmology;  Laterality: Right;   TRANSURETHRAL RESECTION OF PROSTATE  2006    Social History   Socioeconomic History   Marital status: Married    Spouse name: Not on file   Number of children: Not on file   Years of education:  Not on file   Highest education level: Not on file  Occupational History   Not on file  Tobacco Use   Smoking status: Former    Packs/day: 0.25    Years: 10.00    Additional pack years: 0.00    Total pack years: 2.50    Types: Cigarettes    Quit date: 01/08/1984    Years since quitting: 38.5   Smokeless tobacco: Former    Types: Snuff    Quit date: 1980  Vaping Use   Vaping Use: Never used  Substance and Sexual Activity   Alcohol use: Yes    Comment: occ   Drug use: No   Sexual activity: Not on file  Other Topics Concern   Not on file  Social History Narrative   Not on file    Social Determinants of Health   Financial Resource Strain: Not on file  Food Insecurity: Not on file  Transportation Needs: Not on file  Physical Activity: Not on file  Stress: Not on file  Social Connections: Not on file  Intimate Partner Violence: Not on file     Allergies  Allergen Reactions   Penicillins Rash     Outpatient Medications Prior to Visit  Medication Sig Dispense Refill   acetaminophen (TYLENOL) 500 MG tablet Take 500 mg by mouth every 6 (six) hours as needed for moderate pain.      albuterol (PROVENTIL HFA;VENTOLIN HFA) 108 (90 BASE) MCG/ACT inhaler Inhale 1 puff into the lungs every 6 (six) hours as needed for wheezing or shortness of breath.     ALPRAZolam (XANAX) 0.5 MG tablet Take 0.5 mg by mouth 2 (two) times daily as needed. Anxiety     cycloSPORINE (RESTASIS) 0.05 % ophthalmic emulsion 1 drop 2 (two) times daily.     DEXILANT 60 MG capsule Take 60 mg by mouth daily.      diclofenac sodium (VOLTAREN) 1 % GEL Apply 2 g topically 4 (four) times daily as needed (for pain).     fluticasone-salmeterol (ADVAIR DISKUS) 250-50 MCG/ACT AEPB Inhale 1 puff into the lungs in the morning and at bedtime. 1 each 11   linagliptin (TRADJENTA) 5 MG TABS tablet Take 5 mg by mouth daily.     loratadine (CLARITIN) 10 MG tablet Take 10 mg by mouth every morning.     meloxicam (MOBIC) 15 MG tablet Take 15 mg by mouth daily.      methocarbamol (ROBAXIN) 500 MG tablet Take 1 tablet (500 mg total) by mouth every 8 (eight) hours as needed (for lower back and leg pain). 60 tablet 1   methylcellulose (ARTIFICIAL TEARS) 1 % ophthalmic solution Place 1 drop into both eyes 3 (three) times daily as needed. Dry eyes     mometasone (NASONEX) 50 MCG/ACT nasal spray Place 1 spray into both nostrils daily.     Multiple Vitamins-Minerals (CENTRUM SILVER PO) Take 1 tablet by mouth daily.     nystatin (MYCOSTATIN) 100000 UNIT/ML suspension Take 5 mLs (500,000 Units total) by mouth 4 (four) times  daily. 60 mL 0   ondansetron (ZOFRAN) 8 MG tablet Take by mouth every 8 (eight) hours as needed for nausea or vomiting.     oxyCODONE-acetaminophen (PERCOCET/ROXICET) 5-325 MG per tablet Take 1-2 tablets by mouth every 4 (four) hours as needed for moderate pain. 90 tablet 0   oxymetazoline (AFRIN) 0.05 % nasal spray Place 1 spray into both nostrils 2 (two) times daily as needed. Allergies     pentosan polysulfate (  ELMIRON) 100 MG capsule Take 100 mg by mouth 4 (four) times daily.     phenylephrine (SUDAFED PE) 10 MG TABS tablet Take 10 mg by mouth every 4 (four) hours as needed (for congestion).     psyllium (REGULOID) 0.52 G capsule Take 1.56 g by mouth daily.      saxagliptin HCl (ONGLYZA) 2.5 MG TABS tablet Take 2.5 mg by mouth daily.     simvastatin (ZOCOR) 80 MG tablet Take 80 mg by mouth every morning.      solifenacin (VESICARE) 5 MG tablet Take 5 mg by mouth daily.     tadalafil (CIALIS) 5 MG tablet Take 5 mg by mouth daily.     tobramycin-dexamethasone (TOBRADEX) ophthalmic ointment Place 1 application into the right eye 2 (two) times daily. 3.5 g 0   zolpidem (AMBIEN) 10 MG tablet Take 10 mg by mouth at bedtime.      ZUPLENZ 8 MG FILM Place 8 mg under the tongue 2 (two) times daily as needed. Nausea     No facility-administered medications prior to visit.       Objective:   Physical Exam:  General appearance: 72 y.o., male, NAD, conversant  Eyes: anicteric sclerae; PERRL, tracking appropriately HENT: NCAT; MMM Neck: Trachea midline; no lymphadenopathy, no JVD Lungs: CTAB, no crackles, no wheeze, with normal respiratory effort CV: RRR, no murmur  Abdomen: Soft, non-tender; non-distended, BS present  Extremities: No peripheral edema, warm Skin: Normal turgor and texture; no rash Psych: Appropriate affect Neuro: Alert and oriented to person and place, no focal deficit     There were no vitals filed for this visit.      on RA BMI Readings from Last 3 Encounters:   06/03/22 23.13 kg/m  01/15/22 22.21 kg/m  11/12/21 21.46 kg/m   Wt Readings from Last 3 Encounters:  06/03/22 156 lb 9.6 oz (71 kg)  01/15/22 150 lb 6.4 oz (68.2 kg)  11/12/21 147 lb 6.4 oz (66.9 kg)     CBC    Component Value Date/Time   WBC 4.0 11/11/2013 0859   RBC 4.49 11/11/2013 0859   HGB 15.0 10/20/2014 0829   HCT 44.0 10/20/2014 0829   PLT 180 11/11/2013 0859   MCV 86.0 11/11/2013 0859   MCH 28.3 11/11/2013 0859   MCHC 32.9 11/11/2013 0859   RDW 14.2 11/11/2013 0859   LYMPHSABS 0.9 11/11/2013 0859   MONOABS 0.4 11/11/2013 0859   EOSABS 0.1 11/11/2013 0859   BASOSABS 0.0 11/11/2013 0859    Chest Imaging: CXR 2015 reviewed by me with elevated R hemidiaphragm  CXR 11/12/21 stable r hemidiaphragm elevation  Pulmonary Functions Testing Results:    Latest Ref Rng & Units 01/15/2022    2:03 PM  PFT Results  FVC-Pre L 3.69   FVC-Predicted Pre % 87   FVC-Post L 3.77   FVC-Predicted Post % 89   Pre FEV1/FVC % % 58   Post FEV1/FCV % % 74   FEV1-Pre L 2.13   FEV1-Predicted Pre % 69   FEV1-Post L 2.79   DLCO uncorrected ml/min/mmHg 21.06   DLCO UNC% % 84   DLCO corrected ml/min/mmHg 21.06   DLCO COR %Predicted % 84   DLVA Predicted % 101   TLC L 5.94   TLC % Predicted % 85   RV % Predicted % 101        Assessment & Plan:   # DOE  # Possible uncontrolled asthma  # reversible mild obstruction with excellent bronchodilator response #  Chronic productive cough - noted over last 3 months Possibility of asthma - did have some response from DOE standpoint to wixela however ever since he's been on it (and cialis) he's had productive cough (over last 3 months). R hemidiaphragm elevated 2015 which could be related to ACDF or remote hemothorax (although there isn't clearly plaque on CXR then).   Plan: - CXR today - Message sent to pharmacy to see what non-DPI asthma inhaler is least expensive, I'll send you message once I find out and prescribe it - try  nystatin swish/swallow up to 4 times daily in case this is candida laryngitis - would stop wixela for now until we see which alternative inhaler we can go with and then start new inhaler after 1-2 weeks of taking nystatin - albuterol 1-2 puffs as needed - this is your rescue inhaler - if unimpproved by next visit then consider HRCT Chest to look for bronchiectasis - see you in clinic in 2 months or sooner if need be!   RTC 3 months   Omar Person, MD [facility] 07/29/2022 11:58 AM

## 2022-07-29 NOTE — Telephone Encounter (Signed)
Pt calling in to check on Symbicort PA

## 2022-07-30 ENCOUNTER — Encounter: Payer: Self-pay | Admitting: Student

## 2022-07-30 ENCOUNTER — Ambulatory Visit (INDEPENDENT_AMBULATORY_CARE_PROVIDER_SITE_OTHER): Payer: PPO | Admitting: Student

## 2022-07-30 VITALS — BP 130/72 | HR 88 | Temp 98.2°F | Ht 69.0 in | Wt 156.4 lb

## 2022-07-30 DIAGNOSIS — J45998 Other asthma: Secondary | ICD-10-CM | POA: Diagnosis not present

## 2022-07-30 DIAGNOSIS — R053 Chronic cough: Secondary | ICD-10-CM | POA: Diagnosis not present

## 2022-07-30 DIAGNOSIS — R0609 Other forms of dyspnea: Secondary | ICD-10-CM

## 2022-07-30 NOTE — Telephone Encounter (Signed)
Do you know what the status of his PA for symbicort is? He said it was being denied due to the diagnosis that we used  [facility]

## 2022-07-30 NOTE — Patient Instructions (Addendum)
Try Breztri inhaler with spacer device- use 2 puffs every 12 hours and rinse your mouth and spacer after each use   We will continue to work on getting your Symbicort approved   Use Albuterol inhaler 1-2 puffs every 4-6 hours as needed for shortness of breath   Follow up with Dr. Francine Graven in 3 months, call sooner if needed

## 2022-08-01 ENCOUNTER — Other Ambulatory Visit (HOSPITAL_COMMUNITY): Payer: Self-pay

## 2022-08-04 ENCOUNTER — Telehealth: Payer: Self-pay

## 2022-08-04 ENCOUNTER — Other Ambulatory Visit (HOSPITAL_COMMUNITY): Payer: Self-pay

## 2022-08-04 MED ORDER — MOMETASONE FURO-FORMOTEROL FUM 100-5 MCG/ACT IN AERO: 2.0000 | INHALATION_SPRAY | Freq: Two times a day (BID) | RESPIRATORY_TRACT | 11 refills | Status: DC

## 2022-08-04 NOTE — Telephone Encounter (Signed)
New encounter created for [facility]. Status is pending

## 2022-08-04 NOTE — Telephone Encounter (Signed)
Can we try submitting prior auth for dulera 100 mcg 2 puff twice daily? Under diagnosis: uncontrolled persistent asthma. Failed to tolerate DPI due to thrush. Needs non-DPI inhaler that he can pair with spacer to decrease risk of thrush.

## 2022-08-04 NOTE — Telephone Encounter (Signed)
Patient Advocate Encounter   Received notification from [employer] that prior authorization is required for Hunter Bridges 100-5MCG/ACT aerosol   Submitted: 08-04-2022 Key [id number]  Status is pending

## 2022-08-04 NOTE — Telephone Encounter (Signed)
Called insurance to check on status, the appeal was denied and the denial letter sent to the Dr. Isidore Moos 05/26.

## 2022-08-05 NOTE — Telephone Encounter (Signed)
Dr. Thora Lance please advise? PA for Elwin Sleight has been denied. Below I have posted requirements that were included in letter

## 2022-08-05 NOTE — Telephone Encounter (Signed)
PA has been DENIED   Denial letter has been attached in patients media. 

## 2022-08-05 NOTE — Telephone Encounter (Signed)
PA please see message from Dr. Thora Lance below

## 2022-08-05 NOTE — Telephone Encounter (Signed)
I have safety concern regarding use of breo - he had bad bout of thrush with dry powder inhaler already. Will this suffice for contraindication to [facility] in order to have PA approved for dulera/  Sorry this has been such a long road!  Nate

## 2022-08-06 ENCOUNTER — Other Ambulatory Visit: Payer: Self-pay | Admitting: Ophthalmology

## 2022-08-06 DIAGNOSIS — D414 Neoplasm of uncertain behavior of bladder: Secondary | ICD-10-CM | POA: Diagnosis not present

## 2022-08-06 DIAGNOSIS — N400 Enlarged prostate without lower urinary tract symptoms: Secondary | ICD-10-CM | POA: Diagnosis not present

## 2022-08-06 DIAGNOSIS — N138 Other obstructive and reflux uropathy: Secondary | ICD-10-CM | POA: Diagnosis not present

## 2022-08-06 DIAGNOSIS — R3914 Feeling of incomplete bladder emptying: Secondary | ICD-10-CM | POA: Diagnosis not present

## 2022-08-06 DIAGNOSIS — N401 Enlarged prostate with lower urinary tract symptoms: Secondary | ICD-10-CM | POA: Diagnosis not present

## 2022-08-06 DIAGNOSIS — R3912 Poor urinary stream: Secondary | ICD-10-CM | POA: Diagnosis not present

## 2022-08-06 DIAGNOSIS — N4289 Other specified disorders of prostate: Secondary | ICD-10-CM | POA: Diagnosis not present

## 2022-08-06 NOTE — Telephone Encounter (Signed)
Providers office will have to move forward with an additional appeal as this has already been denied multiple times. Information on this is in denial letter in patients media.

## 2022-08-14 DIAGNOSIS — R351 Nocturia: Secondary | ICD-10-CM | POA: Diagnosis not present

## 2022-08-14 DIAGNOSIS — R3916 Straining to void: Secondary | ICD-10-CM | POA: Diagnosis not present

## 2022-08-14 DIAGNOSIS — N401 Enlarged prostate with lower urinary tract symptoms: Secondary | ICD-10-CM | POA: Diagnosis not present

## 2022-08-14 DIAGNOSIS — R3914 Feeling of incomplete bladder emptying: Secondary | ICD-10-CM | POA: Diagnosis not present

## 2022-08-15 ENCOUNTER — Telehealth: Payer: Self-pay | Admitting: Student

## 2022-08-15 NOTE — Telephone Encounter (Signed)
Pt states that his insurance is deny the inhaler due to no diagnosis being implied. Pt states the Dr. Laury Axon to add that he has COPD and asthma in order for the medications to get approved

## 2022-08-20 NOTE — Telephone Encounter (Signed)
ATC LVMTCB x1. We need to know which inhaler pt is referring to.   Dr. Inocencio Homes I do see a dx of Asthma but does pt also have COPD? If so can you add COPD to pt's dx list?

## 2022-08-25 ENCOUNTER — Telehealth: Payer: Self-pay

## 2022-08-25 ENCOUNTER — Other Ambulatory Visit (HOSPITAL_COMMUNITY): Payer: Self-pay

## 2022-08-25 ENCOUNTER — Other Ambulatory Visit: Payer: Self-pay | Admitting: Student

## 2022-08-25 MED ORDER — BUDESONIDE-FORMOTEROL FUMARATE 80-4.5 MCG/ACT IN AERO: 2.0000 | INHALATION_SPRAY | Freq: Two times a day (BID) | RESPIRATORY_TRACT | 12 refills | Status: AC

## 2022-08-25 NOTE — Telephone Encounter (Signed)
Can we try submitting prior auth for symbicort 80 mcg 2 puff twice daily? 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 Failed to tolerate DPI due to thrush.   Thanks!

## 2022-08-25 NOTE — Telephone Encounter (Signed)
Patient Advocate Encounter   Received notification from [employer] that prior authorization is required for Symbicort 80-4.5MCG/ACT aerosol   Submitted: n/a Hunter Bridges   Prior authorization was CANCELLED due to a denial on file for Symbicort on 07-10-2022.

## 2022-08-28 NOTE — Telephone Encounter (Signed)
Please see additional encounters, this med has been denied by insurance.

## 2022-09-09 DIAGNOSIS — M5416 Radiculopathy, lumbar region: Secondary | ICD-10-CM | POA: Diagnosis not present

## 2022-09-09 DIAGNOSIS — E1140 Type 2 diabetes mellitus with diabetic neuropathy, unspecified: Secondary | ICD-10-CM | POA: Diagnosis not present

## 2022-09-09 DIAGNOSIS — E871 Hypo-osmolality and hyponatremia: Secondary | ICD-10-CM | POA: Diagnosis not present

## 2022-09-09 DIAGNOSIS — R0600 Dyspnea, unspecified: Secondary | ICD-10-CM | POA: Diagnosis not present

## 2022-09-09 DIAGNOSIS — H0520 Unspecified exophthalmos: Secondary | ICD-10-CM | POA: Diagnosis not present

## 2022-09-09 DIAGNOSIS — H9319 Tinnitus, unspecified ear: Secondary | ICD-10-CM | POA: Diagnosis not present

## 2022-09-09 DIAGNOSIS — I499 Cardiac arrhythmia, unspecified: Secondary | ICD-10-CM | POA: Diagnosis not present

## 2022-09-09 DIAGNOSIS — I739 Peripheral vascular disease, unspecified: Secondary | ICD-10-CM | POA: Diagnosis not present

## 2022-09-09 DIAGNOSIS — E042 Nontoxic multinodular goiter: Secondary | ICD-10-CM | POA: Diagnosis not present

## 2022-09-09 DIAGNOSIS — G8929 Other chronic pain: Secondary | ICD-10-CM | POA: Diagnosis not present

## 2022-09-09 DIAGNOSIS — N3010 Interstitial cystitis (chronic) without hematuria: Secondary | ICD-10-CM | POA: Diagnosis not present

## 2022-09-09 DIAGNOSIS — E785 Hyperlipidemia, unspecified: Secondary | ICD-10-CM | POA: Diagnosis not present

## 2022-09-16 DIAGNOSIS — H3530 Unspecified macular degeneration: Secondary | ICD-10-CM | POA: Diagnosis not present

## 2022-09-16 DIAGNOSIS — H538 Other visual disturbances: Secondary | ICD-10-CM | POA: Diagnosis not present

## 2022-09-16 DIAGNOSIS — H532 Diplopia: Secondary | ICD-10-CM | POA: Diagnosis not present

## 2022-09-25 DIAGNOSIS — N401 Enlarged prostate with lower urinary tract symptoms: Secondary | ICD-10-CM | POA: Diagnosis not present

## 2022-09-25 DIAGNOSIS — R300 Dysuria: Secondary | ICD-10-CM | POA: Diagnosis not present

## 2022-09-25 DIAGNOSIS — R351 Nocturia: Secondary | ICD-10-CM | POA: Diagnosis not present

## 2022-10-16 DIAGNOSIS — Z1211 Encounter for screening for malignant neoplasm of colon: Secondary | ICD-10-CM | POA: Diagnosis not present

## 2022-10-16 DIAGNOSIS — K449 Diaphragmatic hernia without obstruction or gangrene: Secondary | ICD-10-CM | POA: Diagnosis not present

## 2022-10-16 DIAGNOSIS — K625 Hemorrhage of anus and rectum: Secondary | ICD-10-CM | POA: Diagnosis not present

## 2022-10-16 DIAGNOSIS — K5730 Diverticulosis of large intestine without perforation or abscess without bleeding: Secondary | ICD-10-CM | POA: Diagnosis not present

## 2022-10-16 DIAGNOSIS — K219 Gastro-esophageal reflux disease without esophagitis: Secondary | ICD-10-CM | POA: Diagnosis not present

## 2022-11-25 DIAGNOSIS — L821 Other seborrheic keratosis: Secondary | ICD-10-CM | POA: Diagnosis not present

## 2022-11-25 DIAGNOSIS — L300 Nummular dermatitis: Secondary | ICD-10-CM | POA: Diagnosis not present

## 2022-11-25 DIAGNOSIS — L814 Other melanin hyperpigmentation: Secondary | ICD-10-CM | POA: Diagnosis not present

## 2022-11-25 DIAGNOSIS — D225 Melanocytic nevi of trunk: Secondary | ICD-10-CM | POA: Diagnosis not present

## 2022-12-16 DIAGNOSIS — M25511 Pain in right shoulder: Secondary | ICD-10-CM | POA: Diagnosis not present

## 2022-12-23 DIAGNOSIS — N3010 Interstitial cystitis (chronic) without hematuria: Secondary | ICD-10-CM | POA: Diagnosis not present

## 2022-12-23 DIAGNOSIS — R3912 Poor urinary stream: Secondary | ICD-10-CM | POA: Diagnosis not present

## 2022-12-23 DIAGNOSIS — N401 Enlarged prostate with lower urinary tract symptoms: Secondary | ICD-10-CM | POA: Diagnosis not present

## 2022-12-23 DIAGNOSIS — R3915 Urgency of urination: Secondary | ICD-10-CM | POA: Diagnosis not present

## 2023-01-08 DIAGNOSIS — E1140 Type 2 diabetes mellitus with diabetic neuropathy, unspecified: Secondary | ICD-10-CM | POA: Diagnosis not present

## 2023-01-08 DIAGNOSIS — E785 Hyperlipidemia, unspecified: Secondary | ICD-10-CM | POA: Diagnosis not present

## 2023-01-08 DIAGNOSIS — N3010 Interstitial cystitis (chronic) without hematuria: Secondary | ICD-10-CM | POA: Diagnosis not present

## 2023-01-08 DIAGNOSIS — H9319 Tinnitus, unspecified ear: Secondary | ICD-10-CM | POA: Diagnosis not present

## 2023-01-08 DIAGNOSIS — R0600 Dyspnea, unspecified: Secondary | ICD-10-CM | POA: Diagnosis not present

## 2023-01-08 DIAGNOSIS — G8929 Other chronic pain: Secondary | ICD-10-CM | POA: Diagnosis not present

## 2023-01-08 DIAGNOSIS — E871 Hypo-osmolality and hyponatremia: Secondary | ICD-10-CM | POA: Diagnosis not present

## 2023-01-08 DIAGNOSIS — R972 Elevated prostate specific antigen [PSA]: Secondary | ICD-10-CM | POA: Diagnosis not present

## 2023-01-08 DIAGNOSIS — I739 Peripheral vascular disease, unspecified: Secondary | ICD-10-CM | POA: Diagnosis not present

## 2023-01-08 DIAGNOSIS — F339 Major depressive disorder, recurrent, unspecified: Secondary | ICD-10-CM | POA: Diagnosis not present

## 2023-01-08 DIAGNOSIS — M5416 Radiculopathy, lumbar region: Secondary | ICD-10-CM | POA: Diagnosis not present

## 2023-01-08 DIAGNOSIS — I499 Cardiac arrhythmia, unspecified: Secondary | ICD-10-CM | POA: Diagnosis not present

## 2023-01-13 DIAGNOSIS — R350 Frequency of micturition: Secondary | ICD-10-CM | POA: Diagnosis not present

## 2023-01-22 DIAGNOSIS — N3941 Urge incontinence: Secondary | ICD-10-CM | POA: Diagnosis not present

## 2023-01-27 DIAGNOSIS — N3941 Urge incontinence: Secondary | ICD-10-CM | POA: Diagnosis not present

## 2023-02-03 DIAGNOSIS — N3941 Urge incontinence: Secondary | ICD-10-CM | POA: Diagnosis not present

## 2023-02-10 DIAGNOSIS — R3915 Urgency of urination: Secondary | ICD-10-CM | POA: Diagnosis not present

## 2023-02-10 DIAGNOSIS — R350 Frequency of micturition: Secondary | ICD-10-CM | POA: Diagnosis not present

## 2023-02-17 DIAGNOSIS — R350 Frequency of micturition: Secondary | ICD-10-CM | POA: Diagnosis not present

## 2023-02-26 DIAGNOSIS — R350 Frequency of micturition: Secondary | ICD-10-CM | POA: Diagnosis not present

## 2023-03-03 DIAGNOSIS — R350 Frequency of micturition: Secondary | ICD-10-CM | POA: Diagnosis not present

## 2023-03-03 DIAGNOSIS — R3915 Urgency of urination: Secondary | ICD-10-CM | POA: Diagnosis not present

## 2023-03-10 DIAGNOSIS — R3915 Urgency of urination: Secondary | ICD-10-CM | POA: Diagnosis not present

## 2023-03-10 DIAGNOSIS — R350 Frequency of micturition: Secondary | ICD-10-CM | POA: Diagnosis not present

## 2023-03-17 DIAGNOSIS — R350 Frequency of micturition: Secondary | ICD-10-CM | POA: Diagnosis not present

## 2023-03-17 DIAGNOSIS — R3915 Urgency of urination: Secondary | ICD-10-CM | POA: Diagnosis not present

## 2023-03-24 DIAGNOSIS — R350 Frequency of micturition: Secondary | ICD-10-CM | POA: Diagnosis not present

## 2023-03-24 DIAGNOSIS — R3915 Urgency of urination: Secondary | ICD-10-CM | POA: Diagnosis not present

## 2023-03-25 DIAGNOSIS — Z860101 Personal history of adenomatous and serrated colon polyps: Secondary | ICD-10-CM | POA: Diagnosis not present

## 2023-03-25 DIAGNOSIS — Z1211 Encounter for screening for malignant neoplasm of colon: Secondary | ICD-10-CM | POA: Diagnosis not present

## 2023-03-25 DIAGNOSIS — K625 Hemorrhage of anus and rectum: Secondary | ICD-10-CM | POA: Diagnosis not present

## 2023-03-25 DIAGNOSIS — K648 Other hemorrhoids: Secondary | ICD-10-CM | POA: Diagnosis not present

## 2023-03-25 DIAGNOSIS — Z860100 Personal history of colon polyps, unspecified: Secondary | ICD-10-CM | POA: Diagnosis not present

## 2023-03-25 DIAGNOSIS — K5730 Diverticulosis of large intestine without perforation or abscess without bleeding: Secondary | ICD-10-CM | POA: Diagnosis not present

## 2023-03-31 DIAGNOSIS — R350 Frequency of micturition: Secondary | ICD-10-CM | POA: Diagnosis not present

## 2023-04-06 DIAGNOSIS — L304 Erythema intertrigo: Secondary | ICD-10-CM | POA: Diagnosis not present

## 2023-04-28 DIAGNOSIS — R350 Frequency of micturition: Secondary | ICD-10-CM | POA: Diagnosis not present

## 2023-04-28 DIAGNOSIS — R3915 Urgency of urination: Secondary | ICD-10-CM | POA: Diagnosis not present

## 2023-05-21 DIAGNOSIS — Z1212 Encounter for screening for malignant neoplasm of rectum: Secondary | ICD-10-CM | POA: Diagnosis not present

## 2023-05-21 DIAGNOSIS — E1140 Type 2 diabetes mellitus with diabetic neuropathy, unspecified: Secondary | ICD-10-CM | POA: Diagnosis not present

## 2023-05-21 DIAGNOSIS — E871 Hypo-osmolality and hyponatremia: Secondary | ICD-10-CM | POA: Diagnosis not present

## 2023-05-21 DIAGNOSIS — E785 Hyperlipidemia, unspecified: Secondary | ICD-10-CM | POA: Diagnosis not present

## 2023-05-21 DIAGNOSIS — E042 Nontoxic multinodular goiter: Secondary | ICD-10-CM | POA: Diagnosis not present

## 2023-05-21 DIAGNOSIS — R972 Elevated prostate specific antigen [PSA]: Secondary | ICD-10-CM | POA: Diagnosis not present

## 2023-05-28 DIAGNOSIS — R82998 Other abnormal findings in urine: Secondary | ICD-10-CM | POA: Diagnosis not present

## 2023-05-28 DIAGNOSIS — I739 Peripheral vascular disease, unspecified: Secondary | ICD-10-CM | POA: Diagnosis not present

## 2023-05-28 DIAGNOSIS — Z1339 Encounter for screening examination for other mental health and behavioral disorders: Secondary | ICD-10-CM | POA: Diagnosis not present

## 2023-05-28 DIAGNOSIS — E871 Hypo-osmolality and hyponatremia: Secondary | ICD-10-CM | POA: Diagnosis not present

## 2023-05-28 DIAGNOSIS — G8929 Other chronic pain: Secondary | ICD-10-CM | POA: Diagnosis not present

## 2023-05-28 DIAGNOSIS — H353230 Exudative age-related macular degeneration, bilateral, stage unspecified: Secondary | ICD-10-CM | POA: Diagnosis not present

## 2023-05-28 DIAGNOSIS — R0600 Dyspnea, unspecified: Secondary | ICD-10-CM | POA: Diagnosis not present

## 2023-05-28 DIAGNOSIS — K219 Gastro-esophageal reflux disease without esophagitis: Secondary | ICD-10-CM | POA: Diagnosis not present

## 2023-05-28 DIAGNOSIS — E042 Nontoxic multinodular goiter: Secondary | ICD-10-CM | POA: Diagnosis not present

## 2023-05-28 DIAGNOSIS — M5416 Radiculopathy, lumbar region: Secondary | ICD-10-CM | POA: Diagnosis not present

## 2023-05-28 DIAGNOSIS — N3010 Interstitial cystitis (chronic) without hematuria: Secondary | ICD-10-CM | POA: Diagnosis not present

## 2023-05-28 DIAGNOSIS — E785 Hyperlipidemia, unspecified: Secondary | ICD-10-CM | POA: Diagnosis not present

## 2023-05-28 DIAGNOSIS — F339 Major depressive disorder, recurrent, unspecified: Secondary | ICD-10-CM | POA: Diagnosis not present

## 2023-05-28 DIAGNOSIS — E1140 Type 2 diabetes mellitus with diabetic neuropathy, unspecified: Secondary | ICD-10-CM | POA: Diagnosis not present

## 2023-05-28 DIAGNOSIS — Z1331 Encounter for screening for depression: Secondary | ICD-10-CM | POA: Diagnosis not present

## 2023-05-28 DIAGNOSIS — Z0000 Encounter for general adult medical examination without abnormal findings: Secondary | ICD-10-CM | POA: Diagnosis not present

## 2023-07-21 DIAGNOSIS — R350 Frequency of micturition: Secondary | ICD-10-CM | POA: Diagnosis not present

## 2023-07-23 DIAGNOSIS — M5412 Radiculopathy, cervical region: Secondary | ICD-10-CM | POA: Diagnosis not present

## 2023-07-23 DIAGNOSIS — M5416 Radiculopathy, lumbar region: Secondary | ICD-10-CM | POA: Diagnosis not present

## 2023-08-12 DIAGNOSIS — M5412 Radiculopathy, cervical region: Secondary | ICD-10-CM | POA: Diagnosis not present

## 2023-08-12 DIAGNOSIS — M5136 Other intervertebral disc degeneration, lumbar region with discogenic back pain only: Secondary | ICD-10-CM | POA: Diagnosis not present

## 2023-08-12 DIAGNOSIS — M50223 Other cervical disc displacement at C6-C7 level: Secondary | ICD-10-CM | POA: Diagnosis not present

## 2023-08-12 DIAGNOSIS — Z981 Arthrodesis status: Secondary | ICD-10-CM | POA: Diagnosis not present

## 2023-08-12 DIAGNOSIS — M5126 Other intervertebral disc displacement, lumbar region: Secondary | ICD-10-CM | POA: Diagnosis not present

## 2023-08-12 DIAGNOSIS — M5416 Radiculopathy, lumbar region: Secondary | ICD-10-CM | POA: Diagnosis not present

## 2023-08-12 DIAGNOSIS — M5021 Other cervical disc displacement,  high cervical region: Secondary | ICD-10-CM | POA: Diagnosis not present

## 2023-08-12 DIAGNOSIS — M4316 Spondylolisthesis, lumbar region: Secondary | ICD-10-CM | POA: Diagnosis not present

## 2023-08-12 DIAGNOSIS — M4802 Spinal stenosis, cervical region: Secondary | ICD-10-CM | POA: Diagnosis not present

## 2023-08-12 DIAGNOSIS — M48061 Spinal stenosis, lumbar region without neurogenic claudication: Secondary | ICD-10-CM | POA: Diagnosis not present

## 2023-08-18 DIAGNOSIS — R350 Frequency of micturition: Secondary | ICD-10-CM | POA: Diagnosis not present

## 2023-08-25 DIAGNOSIS — M5412 Radiculopathy, cervical region: Secondary | ICD-10-CM | POA: Diagnosis not present

## 2023-08-25 DIAGNOSIS — M5416 Radiculopathy, lumbar region: Secondary | ICD-10-CM | POA: Diagnosis not present

## 2023-09-07 ENCOUNTER — Encounter (INDEPENDENT_AMBULATORY_CARE_PROVIDER_SITE_OTHER): Admitting: Ophthalmology

## 2023-09-15 DIAGNOSIS — R350 Frequency of micturition: Secondary | ICD-10-CM | POA: Diagnosis not present

## 2023-09-16 ENCOUNTER — Other Ambulatory Visit: Payer: Self-pay | Admitting: Neurological Surgery

## 2023-09-16 DIAGNOSIS — E1140 Type 2 diabetes mellitus with diabetic neuropathy, unspecified: Secondary | ICD-10-CM | POA: Diagnosis not present

## 2023-09-16 DIAGNOSIS — E785 Hyperlipidemia, unspecified: Secondary | ICD-10-CM | POA: Diagnosis not present

## 2023-09-16 DIAGNOSIS — M5416 Radiculopathy, lumbar region: Secondary | ICD-10-CM | POA: Diagnosis not present

## 2023-09-16 DIAGNOSIS — R0600 Dyspnea, unspecified: Secondary | ICD-10-CM | POA: Diagnosis not present

## 2023-09-16 DIAGNOSIS — E042 Nontoxic multinodular goiter: Secondary | ICD-10-CM | POA: Diagnosis not present

## 2023-09-16 DIAGNOSIS — E871 Hypo-osmolality and hyponatremia: Secondary | ICD-10-CM | POA: Diagnosis not present

## 2023-09-16 DIAGNOSIS — H0520 Unspecified exophthalmos: Secondary | ICD-10-CM | POA: Diagnosis not present

## 2023-09-16 DIAGNOSIS — N3010 Interstitial cystitis (chronic) without hematuria: Secondary | ICD-10-CM | POA: Diagnosis not present

## 2023-09-16 DIAGNOSIS — H353230 Exudative age-related macular degeneration, bilateral, stage unspecified: Secondary | ICD-10-CM | POA: Diagnosis not present

## 2023-09-16 DIAGNOSIS — G8929 Other chronic pain: Secondary | ICD-10-CM | POA: Diagnosis not present

## 2023-09-16 DIAGNOSIS — I739 Peripheral vascular disease, unspecified: Secondary | ICD-10-CM | POA: Diagnosis not present

## 2023-09-16 DIAGNOSIS — F339 Major depressive disorder, recurrent, unspecified: Secondary | ICD-10-CM | POA: Diagnosis not present

## 2023-09-23 ENCOUNTER — Encounter (INDEPENDENT_AMBULATORY_CARE_PROVIDER_SITE_OTHER): Admitting: Ophthalmology

## 2023-09-24 DIAGNOSIS — M5412 Radiculopathy, cervical region: Secondary | ICD-10-CM | POA: Diagnosis not present

## 2023-09-29 NOTE — Pre-Procedure Instructions (Signed)
 Surgical Instructions   Your procedure is scheduled on October 05, 2023. Report to [facility] Main Entrance A at 10:30 A.M., then check in with the Admitting office. Any questions or running late day of surgery: call [phone]  Questions prior to your surgery date: call [phone], Monday-Friday, 8am-4pm. If you experience any cold or flu symptoms such as cough, fever, chills, shortness of breath, etc. between now and your scheduled surgery, please notify us  at the above number.     Remember:  Do not eat after midnight the night before your surgery  You may drink clear liquids until 9:30 AM the morning of your surgery.   Clear liquids allowed are: Water, Non-Citrus Juices (without pulp), Carbonated Beverages, Clear Tea (no milk, honey, etc.), Black Coffee Only (NO MILK, CREAM OR POWDERED CREAMER of any kind), and Gatorade.    Take these medicines the morning of surgery with A SIP OF WATER: buPROPion (WELLBUTRIN XL)  escitalopram (LEXAPRO)  loratadine (CLARITIN)  mometasone  (NASONEX) nasal spray  pantoprazole  (PROTONIX )  TRELEGY ELLIPTA  pentosan polysulfate (ELMIRON)    May take these medicines IF NEEDED: acetaminophen  (TYLENOL )  albuterol  (PROVENTIL  HFA;VENTOLIN  HFA) inhaler  - please bring inhaler with you morning of surgery ALPRAZolam  (XANAX ) carboxymethylcellulose (REFRESH PLUS) eye drops  HYDROcodone -acetaminophen  (NORCO)  methocarbamol  (ROBAXIN )  ondansetron  (ZOFRAN )    One week prior to surgery, STOP taking any Aspirin (unless otherwise instructed by your surgeon) Aleve, Naproxen, Ibuprofen, Motrin, Advil, Goody's, BC's, all herbal medications, fish oil, and non-prescription vitamins. This includes your medication: diclofenac sodium (VOLTAREN) GEL and meloxicam (MOBIC)     WHAT DO I DO ABOUT MY DIABETES MEDICATION?   Do not take JANUVIA the morning of surgery.   HOW TO MANAGE YOUR DIABETES BEFORE AND AFTER SURGERY  Why is it important to control my blood  sugar before and after surgery? Improving blood sugar levels before and after surgery helps healing and can limit problems. A way of improving blood sugar control is eating a healthy diet by:  Eating less sugar and carbohydrates  Increasing activity/exercise  Talking with your doctor about reaching your blood sugar goals High blood sugars (greater than 180 mg/dL) can raise your risk of infections and slow your recovery, so you will need to focus on controlling your diabetes during the weeks before surgery. Make sure that the doctor who takes care of your diabetes knows about your planned surgery including the date and location.  How do I manage my blood sugar before surgery? Check your blood sugar at least 4 times a day, starting 2 days before surgery, to make sure that the level is not too high or low.  Check your blood sugar the morning of your surgery when you wake up and every 2 hours until you get to the Short Stay unit.  If your blood sugar is less than 70 mg/dL, you will need to treat for low blood sugar: Do not take insulin. Treat a low blood sugar (less than 70 mg/dL) with  cup of clear juice (cranberry or apple), 4 glucose tablets, OR glucose gel. Recheck blood sugar in 15 minutes after treatment (to make sure it is greater than 70 mg/dL). If your blood sugar is not greater than 70 mg/dL on recheck, call [phone] for further instructions. Report your blood sugar to the short stay nurse when you get to Short Stay.  If you are admitted to the hospital after surgery: Your blood sugar will be checked by the staff and you will probably be  given insulin after surgery (instead of oral diabetes medicines) to make sure you have good blood sugar levels. The goal for blood sugar control after surgery is 80-180 mg/dL.                      Do NOT Smoke (Tobacco/Vaping) for 24 hours prior to your procedure.  If you use a CPAP at night, you may bring your mask/headgear for your overnight  stay.   You will be asked to remove any contacts, glasses, piercing's, hearing aid's, dentures/partials prior to surgery. Please bring cases for these items if needed.    Patients discharged the day of surgery will not be allowed to drive home, and someone needs to stay with them for 24 hours.  SURGICAL WAITING ROOM VISITATION Patients may have no more than 2 support people in the waiting area - these visitors may rotate.   Pre-op nurse will coordinate an appropriate time for 1 ADULT support person, who may not rotate, to accompany patient in pre-op.  Children under the age of 63 must have an adult with them who is not the patient and must remain in the main waiting area with an adult.  If the patient needs to stay at the hospital during part of their recovery, the visitor guidelines for inpatient rooms apply.  Please refer to the [facility] website for the visitor guidelines for any additional information.   If you received a COVID test during your pre-op visit  it is requested that you wear a mask when out in public, stay away from anyone that may not be feeling well and notify your surgeon if you develop symptoms. If you have been in contact with anyone that has tested positive in the last 10 days please notify you surgeon.      Pre-operative 5 CHG Bathing Instructions   You can play a key role in reducing the risk of infection after surgery. Your skin needs to be as free of germs as possible. You can reduce the number of germs on your skin by washing with CHG (chlorhexidine  gluconate) soap before surgery. CHG is an antiseptic soap that kills germs and continues to kill germs even after washing.   DO NOT use if you have an allergy to chlorhexidine /CHG or antibacterial soaps. If your skin becomes reddened or irritated, stop using the CHG and notify one of our RNs at [phone].   Please shower with the CHG soap starting 4 days before surgery using the following schedule:      Please keep in mind the following:  DO NOT shave, including legs and underarms, starting the day of your first shower.   You may shave your face at any point before/day of surgery.  Place clean sheets on your bed the day you start using CHG soap. Use a clean washcloth (not used since being washed) for each shower. DO NOT sleep with pets once you start using the CHG.   CHG Shower Instructions:  Wash your face and private area with normal soap. If you choose to wash your hair, wash first with your normal shampoo.  After you use shampoo/soap, rinse your hair and body thoroughly to remove shampoo/soap residue.  Turn the water OFF and apply about 3 tablespoons (45 ml) of CHG soap to a CLEAN washcloth.  Apply CHG soap ONLY FROM YOUR NECK DOWN TO YOUR TOES (washing for 3-5 minutes)  DO NOT use CHG soap on face, private areas, open wounds, or sores.  Pay special attention to the area where your surgery is being performed.  If you are having back surgery, having someone wash your back for you may be helpful. Wait 2 minutes after CHG soap is applied, then you may rinse off the CHG soap.  Pat dry with a clean towel  Put on clean clothes/pajamas   If you choose to wear lotion, please use ONLY the CHG-compatible lotions that are listed below.  Additional instructions for the day of surgery: DO NOT APPLY any lotions, deodorants, cologne, or perfumes.   Do not bring valuables to the hospital. [facility] is not responsible for any belongings/valuables. Do not wear nail polish, gel polish, artificial nails, or any other type of covering on natural nails (fingers and toes) Do not wear jewelry or makeup Put on clean/comfortable clothes.  Please brush your teeth.  Ask your nurse before applying any prescription medications to the skin.     CHG Compatible Lotions   Aveeno Moisturizing lotion  Cetaphil Moisturizing Cream  Cetaphil Moisturizing Lotion  Clairol Herbal Essence Moisturizing  Lotion, Dry Skin  Clairol Herbal Essence Moisturizing Lotion, Extra Dry Skin  Clairol Herbal Essence Moisturizing Lotion, Normal Skin  Curel Age Defying Therapeutic Moisturizing Lotion with Alpha Hydroxy  Curel Extreme Care Body Lotion  Curel Soothing Hands Moisturizing Hand Lotion  Curel Therapeutic Moisturizing Cream, Fragrance-Free  Curel Therapeutic Moisturizing Lotion, Fragrance-Free  Curel Therapeutic Moisturizing Lotion, Original Formula  Eucerin Daily Replenishing Lotion  Eucerin Dry Skin Therapy Plus Alpha Hydroxy Crme  Eucerin Dry Skin Therapy Plus Alpha Hydroxy Lotion  Eucerin Original Crme  Eucerin Original Lotion  Eucerin Plus Crme Eucerin Plus Lotion  Eucerin TriLipid Replenishing Lotion  Keri Anti-Bacterial Hand Lotion  Keri Deep Conditioning Original Lotion Dry Skin Formula Softly Scented  Keri Deep Conditioning Original Lotion, Fragrance Free Sensitive Skin Formula  Keri Lotion Fast Absorbing Fragrance Free Sensitive Skin Formula  Keri Lotion Fast Absorbing Softly Scented Dry Skin Formula  Keri Original Lotion  Keri Skin Renewal Lotion Keri Silky Smooth Lotion  Keri Silky Smooth Sensitive Skin Lotion  Nivea Body Creamy Conditioning Oil  Nivea Body Extra Enriched Lotion  Nivea Body Original Lotion  Nivea Body Sheer Moisturizing Lotion Nivea Crme  Nivea Skin Firming Lotion  NutraDerm 30 Skin Lotion  NutraDerm Skin Lotion  NutraDerm Therapeutic Skin Cream  NutraDerm Therapeutic Skin Lotion  ProShield Protective Hand Cream  Provon moisturizing lotion  Please read over the following fact sheets that you were given.

## 2023-09-30 ENCOUNTER — Inpatient Hospital Stay (HOSPITAL_COMMUNITY)
Admission: RE | Admit: 2023-09-30 | Discharge: 2023-09-30 | Disposition: A | Discharge status: Home / Self Care | Source: Ambulatory Visit

## 2023-09-30 NOTE — Progress Notes (Signed)
 Pt was no show for pre-admission appointment. RN attempted to call, but no answer. Voicemail left with callback number.

## 2023-10-01 ENCOUNTER — Encounter (HOSPITAL_COMMUNITY): Payer: Self-pay

## 2023-10-01 NOTE — Progress Notes (Signed)
 PCP - Dr Garnette Ore Cardiologist - none  Chest x-ray - n/a EKG - 10/02/23 Stress Test - 20+ yrs ago per pt ECHO - n/a Cardiac Cath - 03/04/03  ICD Pacemaker/Loop - n/a  Sleep Study -  n/a  Diabetes Type 2 Fasting Blood Sugar - 115-120s Checks Blood Sugar 1 times a day  Do not take Januvia on the morning of surgery.  Aspirin & Blood Thinner Instructions:  n/a  ERAS -clear liquids til 9:30 AM DOS.  Anesthesia review: Yes  STOP now taking any Aspirin (unless otherwise instructed by your surgeon), Aleve, Naproxen, Ibuprofen, Mobic, Motrin, Advil, Goody's, BC's, Voltaren Gel, all herbal medications, fish oil, and all vitamins.   Coronavirus Screening Do you have any of the following symptoms:  Cough yes/no: No Fever (>100.36F)  yes/no: No Runny nose yes/no: No Sore throat yes/no: No Difficulty breathing/shortness of breath  Yes-occasional  Have you traveled in the last 14 days and where? yes/no: No  Patient verbalized understanding of instructions that were given to them at the PAT appointment. Patient was also instructed that they will need to review over the PAT instructions again at home before surgery.

## 2023-10-02 ENCOUNTER — Other Ambulatory Visit: Payer: Self-pay

## 2023-10-02 ENCOUNTER — Encounter (HOSPITAL_COMMUNITY)
Admission: RE | Admit: 2023-10-02 | Discharge: 2023-10-02 | Disposition: A | Discharge status: Home / Self Care | Source: Ambulatory Visit | Attending: Neurological Surgery | Admitting: Neurological Surgery

## 2023-10-02 ENCOUNTER — Encounter (HOSPITAL_COMMUNITY): Payer: Self-pay

## 2023-10-02 VITALS — BP 136/84 | HR 101 | Temp 98.2°F | Resp 17 | Ht 69.0 in | Wt 152.0 lb

## 2023-10-02 DIAGNOSIS — Z01812 Encounter for preprocedural laboratory examination: Secondary | ICD-10-CM | POA: Insufficient documentation

## 2023-10-02 DIAGNOSIS — Z01818 Encounter for other preprocedural examination: Secondary | ICD-10-CM

## 2023-10-02 HISTORY — DX: Depression, unspecified: F32.A

## 2023-10-02 HISTORY — DX: Dyspnea, unspecified: R06.00

## 2023-10-02 HISTORY — DX: Male erectile dysfunction, unspecified: N52.9

## 2023-10-02 LAB — TYPE AND SCREEN
ABO/RH(D): A POS
Antibody Screen: NEGATIVE

## 2023-10-02 LAB — BASIC METABOLIC PANEL WITH GFR
Anion gap: 7 (ref 5–15)
BUN: 15 mg/dL (ref 8–23)
CO2: 25 mmol/L (ref 22–32)
Calcium: 9 mg/dL (ref 8.9–10.3)
Chloride: 99 mmol/L (ref 98–111)
Creatinine, Ser: 0.91 mg/dL (ref 0.61–1.24)
GFR, Estimated: >60 mL/min (ref >60)
Glucose, Bld: 137 mg/dL — ABNORMAL HIGH (ref 70–99)
Potassium: 4.2 mmol/L (ref 3.5–5.1)
Sodium: 131 mmol/L — ABNORMAL LOW (ref 135–145)

## 2023-10-02 LAB — CBC
HCT: 39.9 % (ref 39.0–52.0)
Hemoglobin: 13.1 g/dL (ref 13.0–17.0)
MCH: 29.8 pg (ref 26.0–34.0)
MCHC: 32.8 g/dL (ref 30.0–36.0)
MCV: 90.9 fL (ref 80.0–100.0)
Platelets: 190 K/uL (ref 150–400)
RBC: 4.39 MIL/uL (ref 4.22–5.81)
RDW: 13.7 % (ref 11.5–15.5)
WBC: 4.7 K/uL (ref 4.0–10.5)
nRBC: 0 % (ref 0.0–0.2)

## 2023-10-02 LAB — SURGICAL PCR SCREEN
MRSA, PCR: NEGATIVE
Staphylococcus aureus: NEGATIVE

## 2023-10-02 LAB — GLUCOSE, CAPILLARY: Glucose-Capillary: 121 mg/dL — ABNORMAL HIGH (ref 70–99)

## 2023-10-04 NOTE — Anesthesia Preprocedure Evaluation (Signed)
 Anesthesia Evaluation  Patient identified by MRN, date of birth, ID band Patient awake    Reviewed: Allergy & Precautions, NPO status , Patient's Chart, lab work & pertinent test results  Airway Mallampati: II  TM Distance: >3 FB Neck ROM: Limited    Dental  (+) Dental Advisory Given, Teeth Intact   Pulmonary shortness of breath, pneumonia, former smoker   Pulmonary exam normal breath sounds clear to auscultation       Cardiovascular negative cardio ROS  Rhythm:Regular Rate:Normal     Neuro/Psych  PSYCHIATRIC DISORDERS Anxiety Depression     Neuromuscular disease    GI/Hepatic Neg liver ROS, hiatal hernia,GERD  Medicated and Controlled,,  Endo/Other  diabetes    Renal/GU negative Renal ROS     Musculoskeletal  (+) Arthritis ,    Abdominal   Peds  Hematology  (+) Blood dyscrasia, anemia , DOES NOT REFUSE BLOOD PRODUCTS  Anesthesia Other Findings   Reproductive/Obstetrics                              Anesthesia Physical Anesthesia Plan  ASA: 3  Anesthesia Plan: General   Post-op Pain Management: Tylenol  PO (pre-op)*   Induction: Intravenous  PONV Risk Score and Plan: 3 and Ondansetron , Dexamethasone  and Treatment may vary due to age or medical condition  Airway Management Planned: Oral ETT  Additional Equipment:   Intra-op Plan:   Post-operative Plan: Extubation in OR  Informed Consent: I have reviewed the patients History and Physical, chart, labs and discussed the procedure including the risks, benefits and alternatives for the proposed anesthesia with the patient or authorized representative who has indicated his/her understanding and acceptance.     Dental advisory given  Plan Discussed with: CRNA  Anesthesia Plan Comments:          Anesthesia Quick Evaluation

## 2023-10-05 ENCOUNTER — Encounter (HOSPITAL_COMMUNITY): Payer: Self-pay | Admitting: Neurological Surgery

## 2023-10-05 ENCOUNTER — Ambulatory Visit (HOSPITAL_COMMUNITY): Payer: Self-pay | Admitting: Anesthesiology

## 2023-10-05 ENCOUNTER — Observation Stay (HOSPITAL_COMMUNITY)
Admission: RE | Admit: 2023-10-05 | Discharge: 2023-10-06 | Disposition: A | Discharge status: Home / Self Care | Attending: Neurological Surgery | Admitting: Neurological Surgery

## 2023-10-05 ENCOUNTER — Encounter (HOSPITAL_COMMUNITY)
Admission: RE | Disposition: A | Discharge status: Home / Self Care | Payer: Self-pay | Source: Home / Self Care | Attending: Neurological Surgery

## 2023-10-05 ENCOUNTER — Ambulatory Visit (HOSPITAL_BASED_OUTPATIENT_CLINIC_OR_DEPARTMENT_OTHER): Payer: Self-pay | Admitting: Anesthesiology

## 2023-10-05 ENCOUNTER — Other Ambulatory Visit: Payer: Self-pay

## 2023-10-05 ENCOUNTER — Ambulatory Visit (HOSPITAL_COMMUNITY)

## 2023-10-05 DIAGNOSIS — Z87891 Personal history of nicotine dependence: Secondary | ICD-10-CM | POA: Insufficient documentation

## 2023-10-05 DIAGNOSIS — Z7951 Long term (current) use of inhaled steroids: Secondary | ICD-10-CM | POA: Diagnosis not present

## 2023-10-05 DIAGNOSIS — M5440 Lumbago with sciatica, unspecified side: Secondary | ICD-10-CM | POA: Diagnosis present

## 2023-10-05 DIAGNOSIS — Z791 Long term (current) use of non-steroidal anti-inflammatories (NSAID): Secondary | ICD-10-CM | POA: Diagnosis not present

## 2023-10-05 DIAGNOSIS — E119 Type 2 diabetes mellitus without complications: Secondary | ICD-10-CM

## 2023-10-05 DIAGNOSIS — Z7984 Long term (current) use of oral hypoglycemic drugs: Secondary | ICD-10-CM | POA: Insufficient documentation

## 2023-10-05 DIAGNOSIS — T84216A Breakdown (mechanical) of internal fixation device of vertebrae, initial encounter: Secondary | ICD-10-CM | POA: Diagnosis not present

## 2023-10-05 DIAGNOSIS — M48062 Spinal stenosis, lumbar region with neurogenic claudication: Principal | ICD-10-CM | POA: Insufficient documentation

## 2023-10-05 DIAGNOSIS — Z981 Arthrodesis status: Secondary | ICD-10-CM | POA: Diagnosis not present

## 2023-10-05 DIAGNOSIS — M4326 Fusion of spine, lumbar region: Principal | ICD-10-CM | POA: Diagnosis present

## 2023-10-05 DIAGNOSIS — M960 Pseudarthrosis after fusion or arthrodesis: Secondary | ICD-10-CM | POA: Diagnosis not present

## 2023-10-05 DIAGNOSIS — F418 Other specified anxiety disorders: Secondary | ICD-10-CM | POA: Diagnosis not present

## 2023-10-05 DIAGNOSIS — Z79899 Other long term (current) drug therapy: Secondary | ICD-10-CM | POA: Diagnosis not present

## 2023-10-05 DIAGNOSIS — R0602 Shortness of breath: Secondary | ICD-10-CM | POA: Insufficient documentation

## 2023-10-05 DIAGNOSIS — M5416 Radiculopathy, lumbar region: Secondary | ICD-10-CM

## 2023-10-05 LAB — GLUCOSE, CAPILLARY
Glucose-Capillary: 142 mg/dL — ABNORMAL HIGH (ref 70–99)
Glucose-Capillary: 140 mg/dL — ABNORMAL HIGH (ref 70–99)
Glucose-Capillary: 145 mg/dL — ABNORMAL HIGH (ref 70–99)
Glucose-Capillary: 168 mg/dL — ABNORMAL HIGH (ref 70–99)

## 2023-10-05 SURGERY — POSTERIOR LUMBAR FUSION 1 LEVEL
Anesthesia: General | Site: Back

## 2023-10-05 MED ORDER — BUPIVACAINE HCL (PF) 0.25 % IJ SOLN
INTRAMUSCULAR | Status: DC | PRN
  Administered 2023-10-05: 20 mL

## 2023-10-05 MED ORDER — MONTELUKAST SODIUM 10 MG PO TABS
10.0000 mg | ORAL_TABLET | Freq: Every day | ORAL | Status: DC
  Administered 2023-10-05: 10 mg via ORAL
  Filled 2023-10-05: qty 1

## 2023-10-05 MED ORDER — ACETAMINOPHEN 500 MG PO TABS
1000.0000 mg | ORAL_TABLET | ORAL | Status: AC
  Administered 2023-10-05: 500 mg via ORAL
  Filled 2023-10-05: qty 2

## 2023-10-05 MED ORDER — SENNA 8.6 MG PO TABS
1.0000 | ORAL_TABLET | Freq: Two times a day (BID) | ORAL | Status: DC
  Administered 2023-10-05: 8.6 mg via ORAL
  Filled 2023-10-05: qty 1

## 2023-10-05 MED ORDER — FENTANYL CITRATE (PF) 250 MCG/5ML IJ SOLN
INTRAMUSCULAR | Status: AC
  Filled 2023-10-05: qty 5

## 2023-10-05 MED ORDER — LINAGLIPTIN 5 MG PO TABS
5.0000 mg | ORAL_TABLET | Freq: Every day | ORAL | Status: DC
  Administered 2023-10-05: 5 mg via ORAL
  Filled 2023-10-05: qty 1

## 2023-10-05 MED ORDER — POTASSIUM CHLORIDE IN NACL 20-0.9 MEQ/L-% IV SOLN
INTRAVENOUS | Status: DC
  Filled 2023-10-05: qty 1000

## 2023-10-05 MED ORDER — BUPIVACAINE HCL (PF) 0.25 % IJ SOLN
INTRAMUSCULAR | Status: AC
Start: 2023-10-05 — End: 2023-10-05
  Filled 2023-10-05: qty 30

## 2023-10-05 MED ORDER — ONDANSETRON HCL 4 MG/2ML IJ SOLN: 4.0000 mg | Freq: Four times a day (QID) | INTRAMUSCULAR | Status: DC | PRN

## 2023-10-05 MED ORDER — BUDESON-GLYCOPYRROL-FORMOTEROL 160-9-4.8 MCG/ACT IN AERO
2.0000 | INHALATION_SPRAY | Freq: Two times a day (BID) | RESPIRATORY_TRACT | Status: DC
  Administered 2023-10-05: 2 via RESPIRATORY_TRACT
  Filled 2023-10-05: qty 5.9

## 2023-10-05 MED ORDER — METHOCARBAMOL 500 MG PO TABS
500.0000 mg | ORAL_TABLET | Freq: Three times a day (TID) | ORAL | Status: DC | PRN
  Administered 2023-10-05 – 2023-10-06 (×2): 500 mg via ORAL
  Filled 2023-10-05: qty 1

## 2023-10-05 MED ORDER — ONDANSETRON HCL 4 MG/2ML IJ SOLN
INTRAMUSCULAR | Status: DC | PRN
  Administered 2023-10-05: 4 mg via INTRAVENOUS

## 2023-10-05 MED ORDER — PANTOPRAZOLE SODIUM 40 MG PO TBEC: 40.0000 mg | DELAYED_RELEASE_TABLET | Freq: Every day | ORAL | Status: DC

## 2023-10-05 MED ORDER — OXYCODONE-ACETAMINOPHEN 5-325 MG PO TABS
1.0000 | ORAL_TABLET | ORAL | Status: DC | PRN
  Administered 2023-10-05 (×2): 2 via ORAL
  Filled 2023-10-05 (×3): qty 2

## 2023-10-05 MED ORDER — CHLORHEXIDINE GLUCONATE CLOTH 2 % EX PADS: 6.0000 | MEDICATED_PAD | Freq: Once | CUTANEOUS | Status: DC

## 2023-10-05 MED ORDER — PROPOFOL 10 MG/ML IV BOLUS
INTRAVENOUS | Status: AC
  Filled 2023-10-05: qty 20

## 2023-10-05 MED ORDER — GABAPENTIN 300 MG PO CAPS
300.0000 mg | ORAL_CAPSULE | ORAL | Status: AC
  Administered 2023-10-05: 300 mg via ORAL
  Filled 2023-10-05: qty 1

## 2023-10-05 MED ORDER — SODIUM CHLORIDE 0.9% FLUSH: 3.0000 mL | INTRAVENOUS | Status: DC | PRN

## 2023-10-05 MED ORDER — 0.9 % SODIUM CHLORIDE (POUR BTL) OPTIME
TOPICAL | Status: DC | PRN
  Administered 2023-10-05: 1000 mL

## 2023-10-05 MED ORDER — ZOLPIDEM TARTRATE 5 MG PO TABS
5.0000 mg | ORAL_TABLET | Freq: Every evening | ORAL | Status: DC | PRN
  Administered 2023-10-05: 5 mg via ORAL
  Filled 2023-10-05: qty 1

## 2023-10-05 MED ORDER — ROCURONIUM BROMIDE 10 MG/ML (PF) SYRINGE
PREFILLED_SYRINGE | INTRAVENOUS | Status: DC | PRN
  Administered 2023-10-05 (×2): 10 mg via INTRAVENOUS
  Administered 2023-10-05: 60 mg via INTRAVENOUS

## 2023-10-05 MED ORDER — PROPOFOL 10 MG/ML IV BOLUS
INTRAVENOUS | Status: DC | PRN
  Administered 2023-10-05: 100 mg via INTRAVENOUS

## 2023-10-05 MED ORDER — FENTANYL CITRATE (PF) 250 MCG/5ML IJ SOLN
INTRAMUSCULAR | Status: DC | PRN
  Administered 2023-10-05: 50 ug via INTRAVENOUS
  Administered 2023-10-05: 25 ug via INTRAVENOUS
  Administered 2023-10-05: 50 ug via INTRAVENOUS
  Administered 2023-10-05: 25 ug via INTRAVENOUS
  Administered 2023-10-05 (×2): 50 ug via INTRAVENOUS

## 2023-10-05 MED ORDER — LIDOCAINE 2% (20 MG/ML) 5 ML SYRINGE
INTRAMUSCULAR | Status: DC | PRN
  Administered 2023-10-05: 70 mg via INTRAVENOUS

## 2023-10-05 MED ORDER — CHLORHEXIDINE GLUCONATE 0.12 % MT SOLN
OROMUCOSAL | Status: AC
  Administered 2023-10-05: 15 mL via OROMUCOSAL
  Filled 2023-10-05: qty 15

## 2023-10-05 MED ORDER — FENTANYL CITRATE (PF) 100 MCG/2ML IJ SOLN
INTRAMUSCULAR | Status: AC
Start: 2023-10-05 — End: 2023-10-05
  Filled 2023-10-05: qty 2

## 2023-10-05 MED ORDER — THROMBIN 20000 UNITS EX SOLR
CUTANEOUS | Status: DC | PRN
  Administered 2023-10-05: 20 mL via TOPICAL

## 2023-10-05 MED ORDER — ESCITALOPRAM OXALATE 20 MG PO TABS: 20.0000 mg | ORAL_TABLET | Freq: Every day | ORAL | Status: DC

## 2023-10-05 MED ORDER — CELECOXIB 200 MG PO CAPS
200.0000 mg | ORAL_CAPSULE | Freq: Two times a day (BID) | ORAL | Status: DC
  Administered 2023-10-05: 200 mg via ORAL
  Filled 2023-10-05: qty 1

## 2023-10-05 MED ORDER — PSYLLIUM 0.52 G PO CAPS: 0.5200 g | ORAL_CAPSULE | Freq: Every day | ORAL | Status: DC

## 2023-10-05 MED ORDER — ORAL CARE MOUTH RINSE: 15.0000 mL | Freq: Once | OROMUCOSAL | Status: AC

## 2023-10-05 MED ORDER — ACETAMINOPHEN 650 MG RE SUPP: 650.0000 mg | RECTAL | Status: DC | PRN

## 2023-10-05 MED ORDER — ROCURONIUM BROMIDE 10 MG/ML (PF) SYRINGE
PREFILLED_SYRINGE | INTRAVENOUS | Status: AC
  Filled 2023-10-05: qty 10

## 2023-10-05 MED ORDER — ALUM & MAG HYDROXIDE-SIMETH 200-200-20 MG/5ML PO SUSP
30.0000 mL | ORAL | Status: DC | PRN
  Administered 2023-10-05: 30 mL via ORAL
  Filled 2023-10-05: qty 30

## 2023-10-05 MED ORDER — SODIUM CHLORIDE 0.9 % IV SOLN: 250.0000 mL | INTRAVENOUS | Status: DC

## 2023-10-05 MED ORDER — METHOCARBAMOL 500 MG PO TABS
ORAL_TABLET | ORAL | Status: AC
  Filled 2023-10-05: qty 1

## 2023-10-05 MED ORDER — DEXAMETHASONE SODIUM PHOSPHATE 10 MG/ML IJ SOLN
INTRAMUSCULAR | Status: DC | PRN
  Administered 2023-10-05: 5 mg via INTRAVENOUS

## 2023-10-05 MED ORDER — PHENOL 1.4 % MT LIQD: 1.0000 | OROMUCOSAL | Status: DC | PRN

## 2023-10-05 MED ORDER — THROMBIN 20000 UNITS EX SOLR
CUTANEOUS | Status: AC
  Filled 2023-10-05: qty 20000

## 2023-10-05 MED ORDER — PHENYLEPHRINE 80 MCG/ML (10ML) SYRINGE FOR IV PUSH (FOR BLOOD PRESSURE SUPPORT)
PREFILLED_SYRINGE | INTRAVENOUS | Status: DC | PRN
  Administered 2023-10-05: 80 ug via INTRAVENOUS
  Administered 2023-10-05: 120 ug via INTRAVENOUS
  Administered 2023-10-05 (×2): 80 ug via INTRAVENOUS

## 2023-10-05 MED ORDER — SODIUM CHLORIDE 0.9% FLUSH
3.0000 mL | Freq: Two times a day (BID) | INTRAVENOUS | Status: DC
  Administered 2023-10-05: 3 mL via INTRAVENOUS

## 2023-10-05 MED ORDER — MENTHOL 3 MG MT LOZG: 1.0000 | LOZENGE | OROMUCOSAL | Status: DC | PRN

## 2023-10-05 MED ORDER — INSULIN ASPART 100 UNIT/ML IJ SOLN: 0.0000 [IU] | INTRAMUSCULAR | Status: DC | PRN

## 2023-10-05 MED ORDER — DEXAMETHASONE SODIUM PHOSPHATE 10 MG/ML IJ SOLN
INTRAMUSCULAR | Status: AC
  Filled 2023-10-05: qty 1

## 2023-10-05 MED ORDER — SUGAMMADEX SODIUM 200 MG/2ML IV SOLN
INTRAVENOUS | Status: DC | PRN
  Administered 2023-10-05: 200 mg via INTRAVENOUS

## 2023-10-05 MED ORDER — BUPROPION HCL ER (XL) 150 MG PO TB24
150.0000 mg | ORAL_TABLET | Freq: Every morning | ORAL | Status: DC
Start: 2023-10-06 — End: 2023-10-06

## 2023-10-05 MED ORDER — THROMBIN 5000 UNITS EX KIT
PACK | CUTANEOUS | Status: AC
  Filled 2023-10-05: qty 1

## 2023-10-05 MED ORDER — PHENYLEPHRINE HCL-NACL 20-0.9 MG/250ML-% IV SOLN
INTRAVENOUS | Status: DC | PRN
  Administered 2023-10-05: 20 ug/min via INTRAVENOUS

## 2023-10-05 MED ORDER — MORPHINE SULFATE (PF) 2 MG/ML IV SOLN: 2.0000 mg | INTRAVENOUS | Status: DC | PRN

## 2023-10-05 MED ORDER — FLUTICASONE PROPIONATE 50 MCG/ACT NA SUSP
1.0000 | Freq: Every day | NASAL | Status: DC
  Filled 2023-10-05: qty 16

## 2023-10-05 MED ORDER — EPHEDRINE SULFATE-NACL 50-0.9 MG/10ML-% IV SOSY
PREFILLED_SYRINGE | INTRAVENOUS | Status: DC | PRN
  Administered 2023-10-05 (×3): 5 mg via INTRAVENOUS

## 2023-10-05 MED ORDER — FENTANYL CITRATE (PF) 100 MCG/2ML IJ SOLN
INTRAMUSCULAR | Status: AC
  Filled 2023-10-05: qty 2

## 2023-10-05 MED ORDER — METHENAMINE MANDELATE 0.5 G PO TABS
500.0000 mg | ORAL_TABLET | Freq: Every day | ORAL | Status: DC
  Administered 2023-10-05: 500 mg via ORAL
  Filled 2023-10-05 (×2): qty 1

## 2023-10-05 MED ORDER — ONDANSETRON HCL 4 MG/2ML IJ SOLN
INTRAMUSCULAR | Status: AC
  Filled 2023-10-05: qty 2

## 2023-10-05 MED ORDER — ONDANSETRON HCL 4 MG PO TABS
4.0000 mg | ORAL_TABLET | Freq: Four times a day (QID) | ORAL | Status: DC | PRN
  Administered 2023-10-05: 4 mg via ORAL
  Filled 2023-10-05: qty 1

## 2023-10-05 MED ORDER — LACTATED RINGERS IV SOLN: INTRAVENOUS | Status: DC

## 2023-10-05 MED ORDER — CEFAZOLIN SODIUM-DEXTROSE 2-4 GM/100ML-% IV SOLN
2.0000 g | Freq: Three times a day (TID) | INTRAVENOUS | Status: AC
  Administered 2023-10-05 – 2023-10-06 (×2): 2 g via INTRAVENOUS
  Filled 2023-10-05 (×2): qty 100

## 2023-10-05 MED ORDER — FENTANYL CITRATE (PF) 100 MCG/2ML IJ SOLN
25.0000 ug | INTRAMUSCULAR | Status: DC | PRN
  Administered 2023-10-05 (×3): 50 ug via INTRAVENOUS

## 2023-10-05 MED ORDER — CEFAZOLIN SODIUM-DEXTROSE 2-4 GM/100ML-% IV SOLN
2.0000 g | INTRAVENOUS | Status: AC
  Administered 2023-10-05: 2 g via INTRAVENOUS
  Filled 2023-10-05: qty 100

## 2023-10-05 MED ORDER — DROPERIDOL 2.5 MG/ML IJ SOLN: 0.6250 mg | Freq: Once | INTRAMUSCULAR | Status: DC | PRN

## 2023-10-05 MED ORDER — PSYLLIUM 95 % PO PACK
1.0000 | PACK | Freq: Every day | ORAL | Status: DC
  Filled 2023-10-05 (×2): qty 1

## 2023-10-05 MED ORDER — THROMBIN 5000 UNITS EX SOLR
OROMUCOSAL | Status: DC | PRN
  Administered 2023-10-05: 5 mL via TOPICAL

## 2023-10-05 MED ORDER — URO-MP 118 MG PO CAPS: 1.0000 | ORAL_CAPSULE | Freq: Every day | ORAL | Status: DC

## 2023-10-05 MED ORDER — CHLORHEXIDINE GLUCONATE 0.12 % MT SOLN: 15.0000 mL | Freq: Once | OROMUCOSAL | Status: AC

## 2023-10-05 MED ORDER — LIDOCAINE 2% (20 MG/ML) 5 ML SYRINGE
INTRAMUSCULAR | Status: AC
  Filled 2023-10-05: qty 5

## 2023-10-05 MED ORDER — ALBUTEROL SULFATE (2.5 MG/3ML) 0.083% IN NEBU: 3.0000 mL | INHALATION_SOLUTION | Freq: Four times a day (QID) | RESPIRATORY_TRACT | Status: DC | PRN

## 2023-10-05 MED ORDER — ACETAMINOPHEN 325 MG PO TABS: 650.0000 mg | ORAL_TABLET | ORAL | Status: DC | PRN

## 2023-10-05 SURGICAL SUPPLY — 53 items
BAG COUNTER SPONGE SURGICOUNT (BAG) ×2 IMPLANT
BASKET BONE COLLECTION (BASKET) ×2 IMPLANT
BENZOIN TINCTURE PRP APPL 2/3 (GAUZE/BANDAGES/DRESSINGS) ×2 IMPLANT
BIT DRILL MAS PLIF 4.5 DISP (DRILL) IMPLANT
BLADE BONE MILL MEDIUM (MISCELLANEOUS) ×2 IMPLANT
BLADE CLIPPER SURG (BLADE) IMPLANT
BONE MATRIX OSTEOCEL PRO LRG (Bone Implant) IMPLANT
BUR CARBIDE MATCH 3.0 (BURR) ×1 IMPLANT
CAGE COROENT LG 12X9X23-12 (Cage) IMPLANT
CANISTER SUCTION 3000ML PPV (SUCTIONS) ×2 IMPLANT
CLEANSER WND VASHE INSTL 475 (WOUND CARE) ×1 IMPLANT
CNTNR URN SCR LID CUP LEK RST (MISCELLANEOUS) ×2 IMPLANT
COVER BACK TABLE 60X90IN (DRAPES) ×1 IMPLANT
DERMABOND ADVANCED .7 DNX12 (GAUZE/BANDAGES/DRESSINGS) ×1 IMPLANT
DRAPE C-ARM 42X72 X-RAY (DRAPES) ×2 IMPLANT
DRAPE C-ARMOR (DRAPES) ×2 IMPLANT
DRAPE LAPAROTOMY 100X72X124 (DRAPES) ×1 IMPLANT
DRAPE SURG 17X23 STRL (DRAPES) ×1 IMPLANT
DRSG OPSITE POSTOP 4X6 (GAUZE/BANDAGES/DRESSINGS) IMPLANT
DURAPREP 26ML APPLICATOR (WOUND CARE) ×2 IMPLANT
ELECTRODE REM PT RTRN 9FT ADLT (ELECTROSURGICAL) ×1 IMPLANT
EVACUATOR 1/8 PVC DRAIN (DRAIN) ×1 IMPLANT
GAUZE 4X4 16PLY ~~LOC~~+RFID DBL (SPONGE) IMPLANT
GLOVE BIO SURGEON STRL SZ7 (GLOVE) IMPLANT
GLOVE BIO SURGEON STRL SZ8 (GLOVE) ×4 IMPLANT
GLOVE BIOGEL PI IND STRL 7.0 (GLOVE) IMPLANT
GOWN STRL REUS W/ TWL LRG LVL3 (GOWN DISPOSABLE) IMPLANT
GOWN STRL REUS W/ TWL XL LVL3 (GOWN DISPOSABLE) ×2 IMPLANT
GOWN STRL REUS W/TWL 2XL LVL3 (GOWN DISPOSABLE) IMPLANT
GRAFT BN 5X1XSPNE CVD POST DBM (Bone Implant) IMPLANT
HEMOSTAT POWDER KIT SURGIFOAM (HEMOSTASIS) ×2 IMPLANT
KIT BASIN OR (CUSTOM PROCEDURE TRAY) ×1 IMPLANT
KIT TURNOVER KIT B (KITS) ×1 IMPLANT
MILL BONE PREP (MISCELLANEOUS) ×2 IMPLANT
NDL HYPO 25X1 1.5 SAFETY (NEEDLE) ×1 IMPLANT
NEEDLE HYPO 25X1 1.5 SAFETY (NEEDLE) ×1 IMPLANT
NS IRRIG 1000ML POUR BTL (IV SOLUTION) ×1 IMPLANT
PACK LAMINECTOMY NEURO (CUSTOM PROCEDURE TRAY) ×1 IMPLANT
PAD ARMBOARD POSITIONER FOAM (MISCELLANEOUS) ×3 IMPLANT
ROD SPNL 70XPREBNT NS MAS (Rod) IMPLANT
SCREW LOCK FXNS SPNE MAS PL (Screw) IMPLANT
SCREW SHANK 6.5X40 NS LF (Screw) IMPLANT
SCREW SHANK 6.5X65 (Screw) IMPLANT
SCREW TULIP 5.5 (Screw) IMPLANT
SPONGE SURGIFOAM ABS GEL 100 (HEMOSTASIS) ×2 IMPLANT
SPONGE T-LAP 4X18 ~~LOC~~+RFID (SPONGE) IMPLANT
STRIP CLOSURE SKIN 1/2X4 (GAUZE/BANDAGES/DRESSINGS) ×1 IMPLANT
SUT VIC AB 0 CT1 18XCR BRD8 (SUTURE) ×2 IMPLANT
SUT VIC AB 2-0 CP2 18 (SUTURE) ×1 IMPLANT
SUT VIC AB 3-0 SH 8-18 (SUTURE) ×2 IMPLANT
TOWEL GREEN STERILE (TOWEL DISPOSABLE) ×1 IMPLANT
TOWEL GREEN STERILE FF (TOWEL DISPOSABLE) ×1 IMPLANT
WATER STERILE IRR 1000ML POUR (IV SOLUTION) ×1 IMPLANT

## 2023-10-05 NOTE — Op Note (Signed)
 10/05/2023  2:49 PM  PATIENT:  Hunter Bridges  73 y.o. 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 Decompressive lumbar laminectomy, hemi facetectomy and foraminotomies L3-4 requiring more work than would be required for a simple exposure of the disk for PLIF in order to adequately decompress the neural elements and address the spinal stenosis 2. Posterior lumbar interbody fusion L3-4 using PEEK interbody cages packed with morcellized allograft and autograft  3. Posterior fixation L3-5 using Nuvasive cortical pedicle screws.  4. Intertransverse arthrodesis L3-4 L4-5 using morcellized autograft and allograft. 5. Exploration of fusion L4-5 with removal of loosened hardware  SURGEON:  Alm Molt, MD  ASSISTANTS: orlean FNP  ANESTHESIA:  General  EBL: 250 ml  Total I/O In: 1700 [I.V.:1600; IV Piggyback:100] Out: 250 [Blood:250]  BLOOD ADMINISTERED:none  DRAINS: none   INDICATION FOR PROCEDURE: This patient presented with back and leg pain. Imaging revealed adjacent level dz L4-5. The patient tried a reasonable attempt at conservative medical measures without relief. I recommended decompression and instrumented fusion to address the stenosis as well as the segmental  instability.  Patient understood the risks, benefits, and alternatives and potential outcomes and wished to proceed.  At surgery we found 3 of the 4 existing pedicle screws to be loose and found no solid arthrodesis at L4-5, and therefore decided to reinstrument that level and do a redo posterolateral arthrodesis at L4-5, suggesting pseudoarthrosis at that level  PROCEDURE DETAILS:  The patient was brought to the operating room. After induction of generalized endotracheal anesthesia the patient was rolled into the prone position on chest rolls and all pressure points were padded. The  patient's lumbar region was cleaned and then prepped with DuraPrep and draped in the usual sterile fashion. Anesthesia was injected and then a dorsal midline incision was made and carried down to the lumbosacral fascia. The fascia was opened and the paraspinous musculature was taken down in a subperiosteal fashion to expose L3-4 as well as the previously placed instrumentation. A self-retaining retractor was placed.  We remove the locking caps from the old pedicle screws at L3-4 and remove the rods.  We found both L4 pedicle screws in the left L5 pedicle screw to be loose.  These were all removed.  The right L5 screw had good purchase so we left it in place.  intraoperative fluoroscopy confirmed my level, and I started with placement of the L3 cortical pedicle screws. The pedicle screw entry zones were identified utilizing surface landmarks and  AP and lateral fluoroscopy. I scored the cortex with the high-speed drill and then used the hand drill to drill an upward and outward direction into the pedicle. I then tapped line to line. I then placed a 6.5 x 40 mm cortical pedicle screw into the pedicles of L3 bilaterally.    I then turned my attention to the decompression and complete lumbar laminectomies, hemi- facetectomies, and foraminotomies were performed at L3-4.  My nurse practitioner was directly involved in the decompression and exposure of the neural elements. the patient had significant spinal stenosis and this required more work than would be required for a simple exposure of the disc for posterior lumbar interbody fusion which would only require a limited laminotomy. Much more generous decompression and generous foraminotomy was undertaken in order to adequately decompress the neural elements and address the patient's leg pain. The yellow ligament was removed to  expose the underlying dura and nerve roots, and generous foraminotomies were performed to adequately decompress the neural elements. Both the  exiting and traversing nerve roots were decompressed on both sides until a coronary dilator passed easily along the nerve roots. Once the decompression was complete, I turned my attention to the posterior lower lumbar interbody fusion. The epidural venous vasculature was coagulated and cut sharply. Disc space was incised and the initial discectomy was performed with pituitary rongeurs. The disc space was distracted with sequential distractors to a height of 12 mm. We then used a series of scrapers and shavers to prepare the endplates for fusion. The midline was prepared with Epstein curettes. Once the complete discectomy was finished, we packed an appropriate sized interbody cage with local autograft and morcellized allograft, gently retracted the nerve root, and tapped the cage into position at L3-4.  The midline between the cages was packed with morselized autograft and allograft.   We then turned our attention to the placement of the lower pedicle screws.  We tapped the old L4 pedicle screw holes with a 6.5 tap and then placed 6.5 x 35 mm pedicle screws at this level.  We did the same at L5 on the left.   My nurse practitioner assisted in placement of the pedicle screws.  We then decorticated the transverse processes and laid a mixture of morcellized autograft and allograft out over these to perform intertransverse arthrodesis at L3-L5 bilaterally. We then placed lordotic rods into the multiaxial screw heads of the pedicle screws and locked these in position with the locking caps and anti-torque device. We then checked our construct with AP and lateral fluoroscopy. Irrigated with copious amounts of  saline solution. Inspected the nerve roots once again to assure adequate decompression, lined to the dura with Gelfoam,  and then we closed the muscle and the fascia with 0 Vicryl. Closed the subcutaneous tissues with 2-0 Vicryl and subcuticular tissues with 3-0 Vicryl. The skin was closed with benzoin and  Steri-Strips. Dressing was then applied, the patient was awakened from general anesthesia and transported to the recovery room in stable condition. At the end of the procedure all sponge, needle and instrument counts were correct.   PLAN OF CARE: admit to inpatient  PATIENT DISPOSITION:  PACU - hemodynamically stable.   Delay start of Pharmacological VTE agent (>24hrs) due to surgical blood loss or risk of bleeding:  yes

## 2023-10-05 NOTE — H&P (Signed)
 Subjective: Patient is a 73 y.o. male admitted for adjacent level stenosis. Onset of symptoms was several months ago, gradually worsening since that time.  The pain is rated severe, and is located at the across the lower back and radiates to legs. The pain is described as aching and occurs all day. The symptoms have been progressive. Symptoms are exacerbated by exercise, standing, and walking for more than a few minutes. 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 ABOVE PREVIOUS l4-5 FUSION    Past Medical History:  Diagnosis Date   Abdominal pain    Allergy    seasonal   Anemia    hx blood transfusion 11/2013   Anxiety    Arthritis    Depression    Diabetes mellitus without complication (HCC)    checks sugar x 1   Dyspnea    with exertion   ED (erectile dysfunction)    cialis   Enlarged prostate    BPH   Gallbladder disease    GERD (gastroesophageal reflux disease)    Hiatal hernia    Hyperlipidemia    Insomnia    ambien    Nausea alone    Nocturia    Pneumonia    hx x 2    Past Surgical History:  Procedure Laterality Date   CARDIAC CATHETERIZATION  03/04/2003   nml   CERVICAL DISC SURGERY  03/03/2012   CHEST TUBE INSERTION  03/03/2004   DUE TO FX RIBS   CHOLECYSTECTOMY  01/12/2012   Procedure: LAPAROSCOPIC CHOLECYSTECTOMY;  Surgeon: Vicenta DELENA Poli, MD;  Location: [facility];  Service: General;;   COLONOSCOPY  03/04/2011   x several   COLONOSCOPY  2024   polyps   EYE SURGERY  2012,15   strabismus,cataracts   MAXIMUM ACCESS (MAS)POSTERIOR LUMBAR INTERBODY FUSION (PLIF) 1 LEVEL N/A 11/23/2013   Procedure: LUMBAR FOUR-FIVE FOR MAXIMUM ACCESS (MAS) POSTERIOR LUMBAR INTERBODY FUSION (PLIF) ;  Surgeon: Alm GORMAN Molt, MD;  Location: [facility];  Service: Neurosurgery;  Laterality: N/A;   STRABISMUS SURGERY Left 06/23/2014   Procedure: REPAIR STRABISMUS LEFT EYE;  Surgeon: Elsie Salt, MD;  Location: [facility];  Service: Ophthalmology;  Laterality:  Left;   STRABISMUS SURGERY Right 10/20/2014   Procedure: REPAIR STRABISMUS RIGHT EYE;  Surgeon: Elsie Salt, MD;  Location: [facility];  Service: Ophthalmology;  Laterality: Right;   TRANSURETHRAL RESECTION OF PROSTATE  03/03/2004   UPPER GI ENDOSCOPY  2023    Prior to Admission medications   Medication Sig Start Date End Date Taking? Authorizing Provider  acetaminophen  (TYLENOL ) 500 MG tablet Take 500 mg by mouth every 6 (six) hours as needed for moderate pain.    Yes [provider]  albuterol  (PROVENTIL  HFA;VENTOLIN  HFA) 108 (90 BASE) MCG/ACT inhaler Inhale 1 puff into the lungs every 6 (six) hours as needed for wheezing or shortness of breath.   Yes [provider]  ALPRAZolam  (XANAX ) 0.5 MG tablet Take 0.5 mg by mouth 2 (two) times daily as needed for anxiety. 10/25/11  Yes [provider]  AMBIEN  CR 12.5 MG CR tablet Take 12.5 mg by mouth at bedtime. 09/16/23  Yes [provider]  buPROPion  (WELLBUTRIN  XL) 150 MG 24 hr tablet Take 150 mg by mouth every morning.   Yes [provider]  carboxymethylcellulose (REFRESH PLUS) 0.5 % SOLN Place 1 drop into both eyes daily as needed (dry eyes).   Yes [provider]  diclofenac sodium (VOLTAREN) 1 % GEL Apply 2  g topically at bedtime.   Yes [provider]  escitalopram  (LEXAPRO ) 20 MG tablet Take 20 mg by mouth daily. 07/23/16  Yes [provider]  HYDROcodone -acetaminophen  (NORCO) 7.5-325 MG tablet Take 1 tablet by mouth daily as needed for moderate pain (pain score 4-6). 08/03/23  Yes [provider]  JANUVIA 100 MG tablet Take 100 mg by mouth daily. 10/03/16  Yes [provider]  loratadine (CLARITIN) 10 MG tablet Take 10 mg by mouth every morning.   Yes [provider]  meloxicam (MOBIC) 15 MG tablet Take 15 mg by mouth daily.  12/04/11  Yes [provider]  Meth-Hyo-M Bl-Na Phos-Ph Sal (URO-MP ) 118 MG CAPS Take 1 capsule by  mouth daily. 08/15/23  Yes [provider]  methocarbamol  (ROBAXIN ) 500 MG tablet Take 1 tablet (500 mg total) by mouth every 8 (eight) hours as needed (for lower back and leg pain). 11/24/13  Yes Joshua Alm Hamilton, MD  mometasone  (NASONEX) 50 MCG/ACT nasal spray Place 1 spray into both nostrils daily.   Yes [provider]  montelukast  (SINGULAIR ) 10 MG tablet Take 10 mg by mouth at bedtime.   Yes [provider]  ondansetron  (ZOFRAN ) 8 MG tablet Take 8 mg by mouth every 8 (eight) hours as needed for nausea or vomiting.   Yes [provider]  oxymetazoline (AFRIN) 0.05 % nasal spray Place 1 spray into both nostrils at bedtime.   Yes [provider]  pantoprazole  (PROTONIX ) 40 MG tablet Take 40 mg by mouth daily. 08/07/23  Yes [provider]  pentosan polysulfate (ELMIRON) 100 MG capsule Take 200 mg by mouth in the morning and at bedtime.   Yes [provider]  rosuvastatin (CRESTOR) 5 MG tablet Take 5 mg by mouth at bedtime.   Yes [provider]  tadalafil (CIALIS) 5 MG tablet Take 5 mg by mouth at bedtime. 05/16/22  Yes [provider]  TRELEGY ELLIPTA 100-62.5-25 MCG/ACT AEPB Inhale 1 puff into the lungs daily.   Yes [provider]  budesonide -formoterol  (SYMBICORT ) 80-4.5 MCG/ACT inhaler Inhale 2 puffs into the lungs 2 (two) times daily. Patient not taking: Reported on 09/28/2023 08/25/22   Gladis Leonor HERO, MD  Iron-FA-B Cmp-C-Biot-Probiotic (FUSION PLUS) CAPS Take 1 capsule by mouth daily.    [provider]  Multiple Vitamins-Minerals (CENTRUM SILVER PO) Take 1 tablet by mouth daily.    [provider]  nystatin  (MYCOSTATIN ) 100000 UNIT/ML suspension Take 5 mLs (500,000 Units total) by mouth 4 (four) times daily. Patient not taking: Reported on 09/28/2023 06/03/22   Gladis Leonor HERO, MD  oxyCODONE -acetaminophen  (PERCOCET/ROXICET) 5-325 MG per tablet Take 1-2 tablets by mouth every 4  (four) hours as needed for moderate pain. Patient not taking: Reported on 09/28/2023 11/24/13   Joshua Alm Hamilton, MD  psyllium (REGULOID) 0.52 G capsule Take 0.52 g by mouth daily.    [provider]  tobramycin -dexamethasone  (TOBRADEX ) ophthalmic ointment Place 1 application into the right eye 2 (two) times daily. Patient not taking: Reported on 09/28/2023 10/20/14   Neysa Fallow, MD   Allergies  Allergen Reactions   Penicillins Rash    Social History   Tobacco Use   Smoking status: Former    Current packs/day: 0.00    Average packs/day: 0.3 packs/day for 10.0 years (2.5 ttl pk-yrs)    Types: Cigarettes    Start date: 01/07/1974    Quit date: 01/08/1984    Years since quitting: 39.7   Smokeless tobacco: Former  Types: Snuff    Quit date: 1980  Substance Use Topics   Alcohol  use: Not Currently    Comment: occ beer    Family History  Problem Relation Age of Onset   Cancer Mother        colon     Review of Systems  Positive ROS: neg  All other systems have been reviewed and were otherwise negative with the exception of those mentioned in the HPI and as above.  Objective: Vital signs in last 24 hours: Temp:  [98.3 F (36.8 C)] 98.3 F (36.8 C) (08/04 1032) Pulse Rate:  [92] 92 (08/04 1032) Resp:  [17] 17 (08/04 1032) BP: (147)/(84) 147/84 (08/04 1032) SpO2:  [98 %] 98 % (08/04 1032) Weight:  [68.9 kg] 68.9 kg (08/04 1032)  General Appearance: Alert, cooperative, no distress, appears stated age Head: Normocephalic, without obvious abnormality, atraumatic Eyes: PERRL, conjunctiva/corneas clear, EOM's intact    Neck: Supple, symmetrical, trachea midline Back: Symmetric, no curvature, ROM normal, no CVA tenderness Lungs:  respirations unlabored Heart: Regular rate and rhythm Abdomen: Soft, non-tender Extremities: Extremities normal, atraumatic, no cyanosis or edema Pulses: 2+ and symmetric all extremities Skin: Skin color, texture, turgor normal, no  rashes or lesions  NEUROLOGIC:   Mental status: Alert and oriented x4,  no aphasia, good attention span, fund of knowledge, and memory Motor Exam - grossly normal Sensory Exam - grossly normal Reflexes: 1= Coordination - grossly normal Gait - grossly normal Balance - grossly normal Cranial Nerves: I: smell Not tested  II: visual acuity  OS: nl    OD: nl  II: visual fields Full to confrontation  II: pupils Equal, round, reactive to light  III,VII: ptosis None  III,IV,VI: extraocular muscles  Full ROM  V: mastication Normal  V: facial light touch sensation  Normal  V,VII: corneal reflex  Present  VII: facial muscle function - upper  Normal  VII: facial muscle function - lower Normal  VIII: hearing Not tested  IX: soft palate elevation  Normal  IX,X: gag reflex Present  XI: trapezius strength  5/5  XI: sternocleidomastoid strength 5/5  XI: neck flexion strength  5/5  XII: tongue strength  Normal    Data Review Lab Results  Component Value Date   WBC 4.7 10/02/2023   HGB 13.1 10/02/2023   HCT 39.9 10/02/2023   MCV 90.9 10/02/2023   PLT 190 10/02/2023   Lab Results  Component Value Date   NA 131 (L) 10/02/2023   K 4.2 10/02/2023   CL 99 10/02/2023   CO2 25 10/02/2023   BUN 15 10/02/2023   CREATININE 0.91 10/02/2023   GLUCOSE 137 (H) 10/02/2023   Lab Results  Component Value Date   INR 1.06 11/11/2013    Assessment/Plan:  Estimated body mass index is 22.45 kg/m as calculated from the following:   Height as of this encounter: 5' 9 (1.753 m).   Weight as of this encounter: 68.9 kg. Patient admitted for PLIF L3-4. Patient has failed a reasonable attempt at conservative therapy.  I explained the condition and procedure to the patient and answered any questions.  Patient wishes to proceed with procedure as planned. Understands risks/ benefits and typical outcomes of procedure.   Alm GORMAN Molt 10/05/2023 11:48 AM

## 2023-10-05 NOTE — Anesthesia Procedure Notes (Signed)
 Procedure Name: Intubation Date/Time: 10/05/2023 12:35 PM  Performed by: Atanacio Arland HERO, CRNAPre-anesthesia Checklist: Patient identified, Emergency Drugs available, Suction available and Patient being monitored Patient Re-evaluated:Patient Re-evaluated prior to induction Oxygen Delivery Method: Circle System Utilized Preoxygenation: Pre-oxygenation with 100% oxygen Induction Type: IV induction Ventilation: Mask ventilation without difficulty and Oral airway inserted - appropriate to patient size Laryngoscope Size: Mac and 4 Grade View: Grade I Tube type: Oral Tube size: 7.5 mm Number of attempts: 1 Airway Equipment and Method: Stylet and Oral airway Placement Confirmation: ETT inserted through vocal cords under direct vision, positive ETCO2 and breath sounds checked- equal and bilateral Secured at: 23 cm Tube secured with: Tape Dental Injury: Teeth and Oropharynx as per pre-operative assessment

## 2023-10-05 NOTE — Transfer of Care (Signed)
 Immediate Anesthesia Transfer of Care Note  Patient: Hunter Bridges  Procedure(s) Performed: POSTERIOR LUMBAR FUSION 1 LEVEL (Back)  Patient Location: PACU  Anesthesia Type:General  Level of Consciousness: drowsy  Airway & Oxygen Therapy: Patient Spontanous Breathing and Patient connected to face mask oxygen  Post-op Assessment: Report given to RN and Post -op Vital signs reviewed and stable  Post vital signs: Reviewed and stable  Last Vitals:  Vitals Value Taken Time  BP 145/72 10/05/23 14:56  Temp 98.1   Pulse 103 10/05/23 14:59  Resp 14 10/05/23 14:59  SpO2 98 % 10/05/23 14:59  Vitals shown include unfiled device data.  Last Pain:  Vitals:   10/05/23 1104  TempSrc:   PainSc: 0-No pain         Complications: No notable events documented.

## 2023-10-06 DIAGNOSIS — M48062 Spinal stenosis, lumbar region with neurogenic claudication: Secondary | ICD-10-CM | POA: Diagnosis not present

## 2023-10-06 LAB — GLUCOSE, CAPILLARY: Glucose-Capillary: 150 mg/dL — ABNORMAL HIGH (ref 70–99)

## 2023-10-06 MED ORDER — HYDROCODONE-ACETAMINOPHEN 5-325 MG PO TABS
1.0000 | ORAL_TABLET | ORAL | Status: DC | PRN
  Administered 2023-10-06: 2 via ORAL
  Administered 2023-10-06: 1 via ORAL
  Filled 2023-10-06: qty 2
  Filled 2023-10-06: qty 1

## 2023-10-06 MED ORDER — HYDROCODONE-ACETAMINOPHEN 7.5-325 MG PO TABS: 1.0000 | ORAL_TABLET | ORAL | 0 refills | Status: AC | PRN

## 2023-10-06 NOTE — Discharge Instructions (Signed)
 Wound Care Keep incision covered and dry for one week.  If you shower prior to then, cover incision with plastic wrap.  You may remove outer bandage after one week and shower.  Do not put any creams, lotions, or ointments on incision. Leave steri-strips on neck.  They will fall off by themselves. Activity Walk each and every day, increasing distance each day. No lifting greater than 5 lbs.  Avoid bending, arching, or twisting. No driving for 2 weeks; may ride as a passenger locally. If provided with back brace, wear when out of bed.  It is not necessary to wear in bed. Diet Resume your normal diet.  Return to Work Will be discussed at you follow up appointment. Call Your Doctor If Any of These Occur Redness, drainage, or swelling at the wound.  Temperature greater than 101 degrees. Severe pain not relieved by pain medication. Incision starts to come apart. Follow Up Appt Call today for appointment in 1-2 weeks ([phone]) or for problems.  If you have any hardware placed in your spine, you will need an x-ray before your appointment.

## 2023-10-06 NOTE — Evaluation (Signed)
 Occupational Therapy Evaluation Patient Details Name: Hunter Bridges MRN: [id number] DOB: [birth year] Today's Date: 10/06/2023   History of Present Illness   Pt is a 73 yo male presenting 8/4 for PLIF L3-4, posterior fixation L3-5, and removal of loosened hardware of prior L4-5 fusion. PMH includes: anemia, arthritis, DM II, HLD, dyspnea on exertion, and prior cervical and spinal surgery.     Clinical Impressions Pt was evaluated s/p the above spine surgery. Pt independent in ADLs/mobility at baseline. Overall pt was able to perform bed mobility with log roll technique independently, mod independent overall to demo seated figure for for LB dressing, perform functional STS transfers, mobility to / from bathroom no AD, and stand at sink for grooming tasks. Donned lumbar brace seated independently. Provided min verbal cues and education on spinal precautions and compensatory techniques throughout, handout provided and pt demonstrated good recall during ADLs and mobility. Pt does not require further acute OT services. Recommend d/c home with support of family.       If plan is discharge home, recommend the following:   A little help with walking and/or transfers;A little help with bathing/dressing/bathroom;Help with stairs or ramp for entrance;Assist for transportation;Assistance with cooking/housework     Functional Status Assessment   Patient has had a recent decline in their functional status and demonstrates the ability to make significant improvements in function in a reasonable and predictable amount of time.     Equipment Recommendations   None recommended by OT      Precautions/Restrictions   Precautions Precautions: Fall;Back Precaution Booklet Issued: Yes (comment) Recall of Precautions/Restrictions: Impaired Precaution/Restrictions Comments: recalls 3/3 start of session, min vcs for functional use throughout transfers / standing ADLs Required Braces or Orthoses:  Spinal Brace Spinal Brace: Lumbar corset;Applied in sitting position Restrictions Weight Bearing Restrictions Per Provider Order: No     Mobility Bed Mobility Overal bed mobility: Independent   Rolling: Independent         General bed mobility comments: pt able to return demo log roll technique with good carryover    Transfers Overall transfer level: Independent Equipment used: None               General transfer comment: supervision, steady during transfers from multiple surface levels      Balance Overall balance assessment: No apparent balance deficits (not formally assessed) Sitting-balance support: No upper extremity supported, Feet supported Sitting balance-Leahy Scale: Good     Standing balance support: No upper extremity supported, During functional activity Standing balance-Leahy Scale: Good                             ADL either performed or assessed with clinical judgement   ADL Overall ADL's : Needs assistance/impaired     Grooming: Standing;Wash/dry hands;Supervision/safety;Cueing for safety Grooming Details (indicate cue type and reason): min vc for spinal precautions Upper Body Bathing: Supervision/ safety;Sitting   Lower Body Bathing: Sit to/from stand;Supervison/ safety   Upper Body Dressing : Sitting;Set up   Lower Body Dressing: Supervision/safety;Sit to/from stand;Cueing for compensatory techniques Lower Body Dressing Details (indicate cue type and reason): pt able to demo seated figure four to doff/don socks Toilet Transfer: Supervision/safety;Ambulation;Grab bars Toilet Transfer Details (indicate cue type and reason): pt return demos STS toilet transfer with use of GB, min vcs for spinal precautions Toileting- Clothing Manipulation and Hygiene: Supervision/safety;Sit to/from stand       Functional mobility during ADLs: Supervision/safety General ADL Comments:  pt able to return demo functional mobility t/f bathroom with  supervision on OT eval, min vcs for spinal precautions     Vision Baseline Vision/History: 1 Wears glasses Ability to See in Adequate Light: 0 Adequate Patient Visual Report: Other (comment) (pt reporting vestibular symptoms with positional changes)              Pertinent Vitals/Pain Pain Assessment Pain Assessment: Faces Faces Pain Scale: Hurts a little bit Pain Location: incision Pain Descriptors / Indicators: Discomfort Pain Intervention(s): Limited activity within patient's tolerance, Monitored during session, Repositioned     Extremity/Trunk Assessment Upper Extremity Assessment Upper Extremity Assessment: Overall WFL for tasks assessed   Lower Extremity Assessment Lower Extremity Assessment: Defer to PT evaluation   Cervical / Trunk Assessment Cervical / Trunk Assessment: Back Surgery   Communication Communication Communication: No apparent difficulties   Cognition Arousal: Alert Behavior During Therapy: WFL for tasks assessed/performed Cognition: No apparent impairments                               Following commands: Intact       Cueing  General Comments   Cueing Techniques: Verbal cues  VSS on RA, pt reports dizziness with head movements and ringing in his ears at baseline, no falls but this occurs with ADLs, encouraged to pursue OP vestibular PT evaluation   Exercises Exercises: Other exercises Other Exercises Other Exercises: pt able to don spinal brace in seated position independently        Home Living Family/patient expects to be discharged to:: Private residence Living Arrangements: Spouse/significant other;Children Available Help at Discharge: Available 24 hours/day Type of Home: House Home Access: Stairs to enter [employer] of Steps: 1   Home Layout: One level     Bathroom Shower/Tub: Producer, television/film/video: Standard Bathroom Accessibility: Yes   Home Equipment: Cane - single point;Shower seat  - built in;Grab bars - tub/shower;Hand held shower head;Adaptive equipment Adaptive Equipment: Reacher        Prior Functioning/Environment Prior Level of Function : Independent/Modified Independent;Driving             Mobility Comments: independent ADLs Comments: independent, driving    OT Problem List: Decreased strength;Decreased range of motion;Decreased knowledge of precautions;Decreased knowledge of use of DME or AE        OT Goals(Current goals can be found in the care plan section)   Acute Rehab OT Goals OT Goal Formulation: All assessment and education complete, DC therapy Time For Goal Achievement: 10/20/23 Potential to Achieve Goals: Good   AM-PAC OT 6 Clicks Daily Activity     Outcome Measure Help from another person eating meals?: None Help from another person taking care of personal grooming?: None Help from another person toileting, which includes using toliet, bedpan, or urinal?: A Little Help from another person bathing (including washing, rinsing, drying)?: A Little Help from another person to put on and taking off regular upper body clothing?: None Help from another person to put on and taking off regular lower body clothing?: A Little 6 Click Score: 21   End of Session Nurse Communication: Mobility status  Activity Tolerance: Patient tolerated treatment well Patient left: in chair;with call bell/phone within reach  OT Visit Diagnosis: Other abnormalities of gait and mobility (R26.89);Muscle weakness (generalized) (M62.81)                Time: 9099-9084 OT Time Calculation (min): 15 min  Charges:  OT General Charges $OT Visit: 1 Visit OT Evaluation $OT Eval Low Complexity: 1 Low  Korion Cuevas L. Shardee Dieu, OTR/L  10/06/23, 9:30 AM

## 2023-10-06 NOTE — Evaluation (Signed)
 Physical Therapy Brief Evaluation and Discharge Note Patient Details Name: Hunter Bridges MRN: [id number] DOB: [birth year] Today's Date: 10/06/2023   History of Present Illness  The pt is a 73 yo male presenting 8/4 for PLIF L3-4, posterior fixation L3-5, and removal of loosened hardware of prior L4-5 fusion. PMH includes: anemia, arthritis, DM II, HLD, dyspnea on exertion, and prior cervical and spinal surgery.   Clinical Impression  Pt in bed upon arrival of PT, agreeable to evaluation at this time. Prior to admission the pt was completely independent without need for DME and reports no recent falls. The pt lives with his spouse in a home with 1 step to enter, and is hopeful to return to full independence with less pain. The pt was able to complete bed mobility with cues for log roll, sit-stand transfers, hallway ambulation, and stairs without need for assistance or DME. Pt educated on spinal precautions, use of brace, progressive walking program, and car transfers with pt reporting understanding, no further acute PT needs identified at this time. The pt did report onset of dizziness and ringing in his ears with head movements, especially with ADLs prior to surgery, therefore, recommend OP vestibular PT evaluation to further assess for vestibular dysfunction and risk of falls once cleared. Thank you for the consult.            PT Assessment Patient does not need any further PT services  Assistance Needed at Discharge  Intermittent Supervision/Assistance    Equipment Recommendations None recommended by PT  Recommendations for Other Services       Precautions/Restrictions Precautions Precautions: Fall;Back Precaution Booklet Issued: Yes (comment) Recall of Precautions/Restrictions: Impaired Precaution/Restrictions Comments: unable to recall after session, reviewed verbally x3 and handout provided Required Braces or Orthoses: Spinal Brace Spinal Brace: Lumbar corset;Applied in sitting  position Restrictions Weight Bearing Restrictions Per Provider Order: No        Mobility  Bed Mobility Rolling: Independent Supine/Sidelying to sit: Independent Sit to supine/sidelying: Independent General bed mobility comments: cues for log roll but pt able to complete from flat bed withotu assist  Transfers Overall transfer level: Independent Equipment used: None               General transfer comment: supervision in session, stable    Ambulation/Gait Ambulation/Gait assistance: Supervision, Contact guard assist Gait Distance (Feet): 300 Feet Assistive device: None Gait Pattern/deviations: Step-through pattern, Decreased stride length Gait Speed: Below normal (0.19m/s) General Gait Details: slowed speed but stable without challenge. mild increased sway and slowed speed with addition of balance challenge  Home Activity Instructions Home Activity Instructions: car transfers, progressive walking program, spinal precautions  Stairs Stairs: Yes Stairs assistance: Contact guard assist, Min assist Stair Management: No rails, One rail Right, Alternating pattern, Forwards Number of Stairs: 10 General stair comments: pt attempted without UE support but needed minA to steady, cues for use of rails or UE support with stairs  Modified Rankin (Stroke Patients Only)        Balance Overall balance assessment: Mild deficits observed, not formally tested Sitting-balance support: No upper extremity supported, Feet supported Sitting balance-Leahy Scale: Good     Standing balance support: No upper extremity supported, During functional activity Standing balance-Leahy Scale: Good   Standardized Balance Assessment Standardized Balance Assessment : Dynamic Gait Index Dynamic Gait Index Level Surface: Normal Change in Gait Speed: Mild Impairment Gait with Horizontal Head Turns: Moderate Impairment Gait with Vertical Head Turns: Moderate Impairment Gait and Pivot Turn:  Mild Impairment Step  Over Obstacle: Mild Impairment Step Around Obstacles: Normal Steps: Mild Impairment Total Score: 16      Pertinent Vitals/Pain PT - Brief Vital Signs All Vital Signs Stable: Yes Pain Assessment Pain Assessment: 0-10 Pain Score: 6  Pain Location: incision Pain Descriptors / Indicators: Discomfort Pain Intervention(s): Limited activity within patient's tolerance, Monitored during session, Repositioned     Home Living Family/patient expects to be discharged to:: Private residence Living Arrangements: Spouse/significant other;Children Available Help at Discharge: Family;Available 24 hours/day Home Environment: Stairs to enter;No rail  Stairs-Number of Steps: 1 Home Equipment: Cane - single point;Shower seat - built in;Grab bars - tub/shower;Hand held shower head;Adaptive equipment Adaptive Equipment: Reacher      Prior Function Level of Independence: Independent Comments: ambulating 25 min for exercise, doing general stregnth and stretches 3x/week. independent with ADLs. pt does report frequent dizziness with head movements with ADLs    UE/LE Assessment   UE ROM/Strength/Tone/Coordination: WFL    LE ROM/Strength/Tone/Coordination: WFL (grossly 4+/5 to MMT with slightly reduced sensation in R foot but improved from before surgery)      Communication   Communication Communication: No apparent difficulties     Cognition Overall Cognitive Status: Appears within functional limits for tasks assessed/performed       General Comments General comments (skin integrity, edema, etc.): VSS on RA, pt reports dizziness with head movements and ringing in his ears at baseline, no falls but this occurs with ADLs, encouraged to pursue OP vestibular PT evaluation     PT Visit Diagnosis Unsteadiness on feet (R26.81);Pain    No Skilled PT All education completed;Patient will have necessary level of assist by caregiver at discharge;Patient is supervision for all  activity/mobility    AMPAC 6 Clicks Help needed turning from your back to your side while in a flat bed without using bedrails?: None Help needed moving from lying on your back to sitting on the side of a flat bed without using bedrails?: None Help needed moving to and from a bed to a chair (including a wheelchair)?: None Help needed standing up from a chair using your arms (e.g., wheelchair or bedside chair)?: None Help needed to walk in hospital room?: A Little Help needed climbing 3-5 steps with a railing? : A Little 6 Click Score: 22      End of Session Equipment Utilized During Treatment: Gait belt;Back brace Activity Tolerance: Patient tolerated treatment well Patient left: in bed;with call bell/phone within reach Nurse Communication: Mobility status PT Visit Diagnosis: Unsteadiness on feet (R26.81);Pain Pain - Right/Left: Right Pain - part of body: Leg (back)     Time: 9198-9174 PT Time Calculation (min) (ACUTE ONLY): 24 min  Charges:   PT Evaluation $PT Eval Low Complexity: 1 Low PT Treatments $Therapeutic Exercise: 8-22 mins    Izetta Call, PT, DPT   Acute Rehabilitation Department Office [phone] Secure Chat Communication Preferred  Izetta JULIANNA Call  10/06/2023, 8:41 AM

## 2023-10-06 NOTE — Discharge Summary (Signed)
 Physician Discharge Summary  Patient ID: Hunter Bridges MRN: [id number] DOB/AGE: [birth year] 75 y.o.  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 The patient tolerated the procedure well and was taken to the recovery room and then to the floor in stable condition. The hospital course was routine. There were no complications. The wound remained clean dry and intact. Pt had appropriate back soreness. No complaints of leg pain or new N/T/W. The patient remained afebrile with stable vital signs, and tolerated a regular diet. The patient continued to increase activities, and pain was well controlled with oral pain medications.   Consults: None  Significant Diagnostic Studies:  Results for orders placed or performed during the hospital encounter of 10/05/23  Glucose, capillary   Collection Time: 10/05/23 10:30 AM  Result Value Ref Range   Glucose-Capillary 142 (H) 70 - 99 mg/dL  Glucose, capillary   Collection Time: 10/05/23  2:59 PM  Result Value Ref Range   Glucose-Capillary 145 (H) 70 - 99 mg/dL  Glucose, capillary   Collection Time: 10/05/23  3:24 PM  Result Value Ref Range   Glucose-Capillary 140 (H) 70 - 99 mg/dL  Glucose, capillary   Collection Time: 10/05/23  8:59 PM  Result Value Ref Range   Glucose-Capillary 168 (H) 70 - 99 mg/dL   Comment 1 Notify RN    Comment 2 Document in Chart   Glucose, capillary   Collection Time: 10/06/23  6:38 AM  Result Value Ref Range   Glucose-Capillary 150 (H) 70 - 99 mg/dL   Comment 1 Notify RN    Comment 2 Document in Chart     DG Lumbar Spine 2-3 Views Result Date: 10/05/2023 CLINICAL DATA:  Elective surgery. EXAM: LUMBAR SPINE - 2-3 VIEW COMPARISON:   Radiograph 08/25/2023 FINDINGS: Four fluoroscopic spot views of the lumbar spine submitted from the operating room. Previous L4-L5 fusion is extended to the L3-L4 level with new L3-L4 interbody spacer. Fluoroscopy time 35.1 seconds. Dose 15.06 mGy. IMPRESSION: Intraoperative fluoroscopy during lumbar spine surgery. Electronically Signed   By: Andrea Gasman M.D.   On: 10/05/2023 17:04   DG C-Arm 1-60 Min-No Report Result Date: 10/05/2023 Fluoroscopy was utilized by the requesting physician.  No radiographic interpretation.   DG C-Arm 1-60 Min-No Report Result Date: 10/05/2023 Fluoroscopy was utilized by the requesting physician.  No radiographic interpretation.    Antibiotics:  Anti-infectives (From admission, onward)    Start     Dose/Rate Route Frequency Ordered Stop   10/05/23 2000  ceFAZolin  (ANCEF ) IVPB 2g/100 mL premix        2 g 200 mL/hr over 30 Minutes Intravenous Every 8 hours 10/05/23 1554 10/06/23 0356   10/05/23 1815  methenamine  (MANDELAMINE) tablet 500 mg        500 mg Oral Daily 10/05/23 1717     10/05/23 1600  Uro-MP  118 MG CAPS 118 mg  Status:  Discontinued        1 capsule Oral Daily 10/05/23 1554 10/05/23 1717   10/05/23 1030  ceFAZolin  (ANCEF ) IVPB 2g/100 mL premix        2 g 200 mL/hr over 30 Minutes Intravenous On call to O.R. 10/05/23 1020 10/05/23 1253       Discharge Exam: Blood pressure (!) 106/90,  pulse (!) 106, temperature 99 F (37.2 C), temperature source Oral, resp. rate 18, height 5' 9 (1.753 m), weight 68.9 kg, SpO2 96%. Neurologic: Grossly normal Dressing dry  Discharge Medications:   Allergies as of 10/06/2023       Reactions   Penicillins Rash        Medication List     STOP taking these medications    oxyCODONE -acetaminophen  5-325 MG tablet Commonly known as: PERCOCET/ROXICET       TAKE these medications    acetaminophen  500 MG tablet Commonly known as: TYLENOL  Take 500 mg by mouth every 6 (six) hours as needed for moderate  pain.   albuterol  108 (90 Base) MCG/ACT inhaler Commonly known as: VENTOLIN  HFA Inhale 1 puff into the lungs every 6 (six) hours as needed for wheezing or shortness of breath.   ALPRAZolam  0.5 MG tablet Commonly known as: XANAX  Take 0.5 mg by mouth 2 (two) times daily as needed for anxiety.   Ambien  CR 12.5 MG CR tablet Generic drug: zolpidem  Take 12.5 mg by mouth at bedtime.   budesonide -formoterol  80-4.5 MCG/ACT inhaler Commonly known as: Symbicort  Inhale 2 puffs into the lungs 2 (two) times daily.   buPROPion  150 MG 24 hr tablet Commonly known as: WELLBUTRIN  XL Take 150 mg by mouth every morning.   carboxymethylcellulose 0.5 % Soln Commonly known as: REFRESH PLUS Place 1 drop into both eyes daily as needed (dry eyes).   CENTRUM SILVER PO Take 1 tablet by mouth daily.   diclofenac sodium 1 % Gel Commonly known as: VOLTAREN Apply 2 g topically at bedtime.   escitalopram  20 MG tablet Commonly known as: LEXAPRO  Take 20 mg by mouth daily.   Fusion Plus Caps Take 1 capsule by mouth daily.   HYDROcodone -acetaminophen  7.5-325 MG tablet Commonly known as: NORCO Take 1 tablet by mouth every 4 (four) hours as needed for moderate pain (pain score 4-6). What changed: when to take this   Januvia 100 MG tablet Generic drug: sitaGLIPtin Take 100 mg by mouth daily.   loratadine 10 MG tablet Commonly known as: CLARITIN Take 10 mg by mouth every morning.   meloxicam 15 MG tablet Commonly known as: MOBIC Take 15 mg by mouth daily.   methocarbamol  500 MG tablet Commonly known as: ROBAXIN  Take 1 tablet (500 mg total) by mouth every 8 (eight) hours as needed (for lower back and leg pain).   mometasone  50 MCG/ACT nasal spray Commonly known as: NASONEX Place 1 spray into both nostrils daily.   montelukast  10 MG tablet Commonly known as: SINGULAIR  Take 10 mg by mouth at bedtime.   nystatin  100000 UNIT/ML suspension Commonly known as: MYCOSTATIN  Take 5 mLs (500,000  Units total) by mouth 4 (four) times daily.   ondansetron  8 MG tablet Commonly known as: ZOFRAN  Take 8 mg by mouth every 8 (eight) hours as needed for nausea or vomiting.   oxymetazoline 0.05 % nasal spray Commonly known as: AFRIN Place 1 spray into both nostrils at bedtime.   pantoprazole  40 MG tablet Commonly known as: PROTONIX  Take 40 mg by mouth daily.   pentosan polysulfate 100 MG capsule Commonly known as: ELMIRON Take 200 mg by mouth in the morning and at bedtime.   psyllium 0.52 g capsule Commonly known as: REGULOID Take 0.52 g by mouth daily.   rosuvastatin 5 MG tablet Commonly known as: CRESTOR Take 5 mg by mouth at bedtime.   tadalafil 5 MG tablet Commonly known as: CIALIS Take 5 mg by mouth  at bedtime.   tobramycin -dexamethasone  ophthalmic ointment Commonly known as: TobraDex  Place 1 application into the right eye 2 (two) times daily.   Trelegy Ellipta 100-62.5-25 MCG/ACT Aepb Generic drug: Fluticasone -Umeclidin-Vilant Inhale 1 puff into the lungs daily.   Uro-MP  118 MG Caps Take 1 capsule by mouth daily.               Durable Medical Equipment  (From admission, onward)           Start     Ordered   10/05/23 1555  DME Walker rolling  Once       Question:  Patient needs a walker to treat with the following condition  Answer:  S/P lumbar fusion   10/05/23 1554   10/05/23 1555  DME 3 n 1  Once        10/05/23 1554            Disposition: home   Final Dx: PLIF L3-4, re-do fusion L4-5  Discharge Instructions      Remove dressing in 72 hours   Complete by: As directed    Call MD for:  difficulty breathing, headache or visual disturbances   Complete by: As directed    Call MD for:  persistant nausea and vomiting   Complete by: As directed    Call MD for:  redness, tenderness, or signs of infection (pain, swelling, redness, odor or green/yellow discharge around incision site)   Complete by: As directed    Call MD for:  severe  uncontrolled pain   Complete by: As directed    Call MD for:  temperature >100.4   Complete by: As directed    Diet - low sodium heart healthy   Complete by: As directed    Increase activity slowly   Complete by: As directed           Signed: Alm GORMAN Molt 10/06/2023, 7:45 AM

## 2023-10-06 NOTE — Progress Notes (Signed)
 Pt doing well. Pt given D/C instructions with verbal understanding. Rx's were sent to the pharmacy by MD. Pt's incision is clean and dry with no sign of infection. Pt's IV was removed prior to D/C. Pt D/C'd home via wheelchair per MD order. Pt is stable @ D/C and has no other needs at this time. Rema Fendt, RN

## 2023-10-06 NOTE — Anesthesia Postprocedure Evaluation (Signed)
 Anesthesia Post Note  Patient: Hunter Bridges  Procedure(s) Performed: POSTERIOR LUMBAR FUSION 1 LEVEL (Back)     Patient location during evaluation: PACU Anesthesia Type: General Level of consciousness: awake and alert, oriented and patient cooperative Pain management: pain level controlled Vital Signs Assessment: post-procedure vital signs reviewed and stable Respiratory status: spontaneous breathing, nonlabored ventilation and respiratory function stable Cardiovascular status: blood pressure returned to baseline and stable Postop Assessment: no apparent nausea or vomiting Anesthetic complications: no   No notable events documented.  Last Vitals:  Vitals:   10/05/23 2339 10/06/23 0313  BP: (!) 157/89 (!) 106/90  Pulse: (!) 104 (!) 106  Resp: 16 18  Temp: 36.9 C 37.2 C  SpO2: 97% 96%    Last Pain:  Vitals:   10/06/23 0417  TempSrc:   PainSc: Asleep                 [name]

## 2023-10-26 ENCOUNTER — Encounter (INDEPENDENT_AMBULATORY_CARE_PROVIDER_SITE_OTHER): Admitting: Ophthalmology

## 2023-10-26 DIAGNOSIS — H353132 Nonexudative age-related macular degeneration, bilateral, intermediate dry stage: Secondary | ICD-10-CM

## 2023-10-26 DIAGNOSIS — H43813 Vitreous degeneration, bilateral: Secondary | ICD-10-CM | POA: Diagnosis not present

## 2023-10-29 DIAGNOSIS — M5416 Radiculopathy, lumbar region: Secondary | ICD-10-CM | POA: Diagnosis not present

## 2023-10-29 DIAGNOSIS — Z981 Arthrodesis status: Secondary | ICD-10-CM | POA: Diagnosis not present

## 2023-11-03 DIAGNOSIS — R350 Frequency of micturition: Secondary | ICD-10-CM | POA: Diagnosis not present

## 2023-11-03 DIAGNOSIS — R3915 Urgency of urination: Secondary | ICD-10-CM | POA: Diagnosis not present

## 2023-12-01 DIAGNOSIS — M5416 Radiculopathy, lumbar region: Secondary | ICD-10-CM | POA: Diagnosis not present

## 2023-12-01 DIAGNOSIS — R3915 Urgency of urination: Secondary | ICD-10-CM | POA: Diagnosis not present

## 2023-12-01 DIAGNOSIS — R350 Frequency of micturition: Secondary | ICD-10-CM | POA: Diagnosis not present

## 2023-12-01 DIAGNOSIS — M4316 Spondylolisthesis, lumbar region: Secondary | ICD-10-CM | POA: Diagnosis not present

## 2024-01-13 DIAGNOSIS — E042 Nontoxic multinodular goiter: Secondary | ICD-10-CM | POA: Diagnosis not present

## 2024-01-13 DIAGNOSIS — D508 Other iron deficiency anemias: Secondary | ICD-10-CM | POA: Diagnosis not present

## 2024-01-13 DIAGNOSIS — E1140 Type 2 diabetes mellitus with diabetic neuropathy, unspecified: Secondary | ICD-10-CM | POA: Diagnosis not present

## 2024-01-26 ENCOUNTER — Ambulatory Visit: Admitting: Radiology

## 2024-01-26 ENCOUNTER — Ambulatory Visit
Admission: EM | Admit: 2024-01-26 | Discharge: 2024-01-26 | Disposition: A | Discharge status: Home / Self Care | Attending: Emergency Medicine | Admitting: Emergency Medicine

## 2024-01-26 ENCOUNTER — Other Ambulatory Visit: Payer: Self-pay

## 2024-01-26 DIAGNOSIS — R051 Acute cough: Secondary | ICD-10-CM | POA: Diagnosis not present

## 2024-01-26 DIAGNOSIS — R350 Frequency of micturition: Secondary | ICD-10-CM | POA: Diagnosis not present

## 2024-01-26 DIAGNOSIS — R059 Cough, unspecified: Secondary | ICD-10-CM | POA: Diagnosis not present

## 2024-01-26 DIAGNOSIS — R0600 Dyspnea, unspecified: Secondary | ICD-10-CM

## 2024-01-26 DIAGNOSIS — R9389 Abnormal findings on diagnostic imaging of other specified body structures: Secondary | ICD-10-CM | POA: Diagnosis not present

## 2024-01-26 DIAGNOSIS — J069 Acute upper respiratory infection, unspecified: Secondary | ICD-10-CM

## 2024-01-26 DIAGNOSIS — Z981 Arthrodesis status: Secondary | ICD-10-CM | POA: Diagnosis not present

## 2024-01-26 LAB — POC COVID19/FLU A&B COMBO
Covid Antigen, POC: NEGATIVE
Influenza A Antigen, POC: NEGATIVE
Influenza B Antigen, POC: NEGATIVE

## 2024-01-26 LAB — POCT RESPIRATORY SYNCYTIAL VIRUS: RSV Rapid Ag: NEGATIVE

## 2024-01-26 MED ORDER — DEXAMETHASONE SOD PHOSPHATE PF 10 MG/ML IJ SOLN
10.0000 mg | Freq: Once | INTRAMUSCULAR | Status: AC
  Administered 2024-01-26: 10 mg via INTRAMUSCULAR

## 2024-01-26 MED ORDER — BENZONATATE 100 MG PO CAPS: 100.0000 mg | ORAL_CAPSULE | Freq: Three times a day (TID) | ORAL | 0 refills | Status: AC

## 2024-01-26 NOTE — Discharge Instructions (Addendum)
 Chest x-ray did not show evidence of pneumonia.  COVID, flu and RSV testing were all negative.  We have given you a steroid injection to help with inflammation in the lungs.  You can take over-the-counter Mucinex 1200 mg daily to help loosen secretions in addition to at least 64 ounces of water daily.  Take the Tessalon  Perles as needed to help with cough suppression, this might not be covered by your insurance.  Symptoms should improve over the next 5 to 7 days, if no improvement or any changes follow-up with your primary care provider or return to clinic for reevaluation.

## 2024-01-26 NOTE — ED Triage Notes (Addendum)
 Pt presents with complaints of cough, congestion, chills, nausea, sore throat, and headaches x 4 days. States he feels SOB and feels like he is gurgling when he breaths. Has been using his inhaler at home, no improvement/relief. Does state Tylenol  helps. Currently rates overall pain a 7/10. Denies fevers/sick contacts. Uses Claritin + nasal spray daily. Per pt, phlegm has blood streaks in it.

## 2024-01-26 NOTE — ED Provider Notes (Signed)
 [facility]    CSN: [id number] Arrival date & time: 01/26/24  1829      History   Chief Complaint Chief Complaint  Patient presents with   Cough    HPI Hunter Bridges is a 73 y.o. male.   Patient presents to clinic over concern of headache, cough, congestion, generalized bodyaches, chills, nausea, sore throat, central chest pain with coughing and shortness of breath.  Symptoms have been present for the past 4 days or so.  Does have shortness of breath at baseline but this is worse than usual.  Has been using his inhaler at home without any improvement.  Tylenol  has been helping.  Has not had recent sick contacts.  Has also been using Claritin and a nasal spray.  Did get vaccinated against RSV.  History of type 2 diabetes.  Does have continuous glucose monitoring and last A1c was 6.0 per patient.  Patient on Jardiance.  The history is provided by the patient and medical records.  Cough   Past Medical History:  Diagnosis Date   Abdominal pain    Allergy    seasonal   Anemia    hx blood transfusion 11/2013   Anxiety    Arthritis    Depression    Diabetes mellitus without complication (HCC)    checks sugar x 1   Dyspnea    with exertion   ED (erectile dysfunction)    cialis   Enlarged prostate    BPH   Gallbladder disease    GERD (gastroesophageal reflux disease)    Hiatal hernia    Hyperlipidemia    Insomnia    ambien    Nausea alone    Nocturia    Pneumonia    hx x 2    Patient Active Problem List   Diagnosis Date Noted   Fusion of lumbar spine 10/05/2023   S/P lumbar spinal fusion 11/23/2013   Biliary dyskinesia 01/01/2012    Past Surgical History:  Procedure Laterality Date   CARDIAC CATHETERIZATION  03/04/2003   nml   CERVICAL DISC SURGERY  03/03/2012   CHEST TUBE INSERTION  03/03/2004   DUE TO FX RIBS   CHOLECYSTECTOMY  01/12/2012   Procedure: LAPAROSCOPIC CHOLECYSTECTOMY;  Surgeon: Vicenta DELENA Poli, MD;  Location: [facility];   Service: General;;   COLONOSCOPY  03/04/2011   x several   COLONOSCOPY  2024   polyps   EYE SURGERY  2012,15   strabismus,cataracts   MAXIMUM ACCESS (MAS)POSTERIOR LUMBAR INTERBODY FUSION (PLIF) 1 LEVEL N/A 11/23/2013   Procedure: LUMBAR FOUR-FIVE FOR MAXIMUM ACCESS (MAS) POSTERIOR LUMBAR INTERBODY FUSION (PLIF) ;  Surgeon: Alm GORMAN Molt, MD;  Location: [facility];  Service: Neurosurgery;  Laterality: N/A;   STRABISMUS SURGERY Left 06/23/2014   Procedure: REPAIR STRABISMUS LEFT EYE;  Surgeon: Elsie Salt, MD;  Location: [facility];  Service: Ophthalmology;  Laterality: Left;   STRABISMUS SURGERY Right 10/20/2014   Procedure: REPAIR STRABISMUS RIGHT EYE;  Surgeon: Elsie Salt, MD;  Location: [facility];  Service: Ophthalmology;  Laterality: Right;   TRANSURETHRAL RESECTION OF PROSTATE  03/03/2004   UPPER GI ENDOSCOPY  2023       Home Medications    Prior to Admission medications   Medication Sig Start Date End Date Taking? Authorizing Provider  benzonatate  (TESSALON ) 100 MG capsule Take 1 capsule (100 mg total) by mouth every 8 (eight) hours. 01/26/24  Yes Advaith Lamarque  N, FNP  acetaminophen  (TYLENOL ) 500 MG tablet Take 500  mg by mouth every 6 (six) hours as needed for moderate pain.     [provider]  albuterol  (PROVENTIL  HFA;VENTOLIN  HFA) 108 (90 BASE) MCG/ACT inhaler Inhale 1 puff into the lungs every 6 (six) hours as needed for wheezing or shortness of breath.    [provider]  ALPRAZolam  (XANAX ) 0.5 MG tablet Take 0.5 mg by mouth 2 (two) times daily as needed for anxiety. 10/25/11   [provider]  AMBIEN  CR 12.5 MG CR tablet Take 12.5 mg by mouth at bedtime. 09/16/23   [provider]  budesonide -formoterol  (SYMBICORT ) 80-4.5 MCG/ACT inhaler Inhale 2 puffs into the lungs 2 (two) times daily. Patient not taking: Reported on 09/28/2023 08/25/22   Gladis Leonor HERO, MD  buPROPion  (WELLBUTRIN  XL) 150 MG 24  hr tablet Take 150 mg by mouth every morning.    [provider]  carboxymethylcellulose (REFRESH PLUS) 0.5 % SOLN Place 1 drop into both eyes daily as needed (dry eyes).    [provider]  diclofenac sodium (VOLTAREN) 1 % GEL Apply 2 g topically at bedtime.    [provider]  escitalopram  (LEXAPRO ) 20 MG tablet Take 20 mg by mouth daily. 07/23/16   [provider]  HYDROcodone -acetaminophen  (NORCO) 7.5-325 MG tablet Take 1 tablet by mouth every 4 (four) hours as needed for moderate pain (pain score 4-6). 10/06/23   Joshua Alm Hamilton, MD  Iron-FA-B Cmp-C-Biot-Probiotic (FUSION PLUS) CAPS Take 1 capsule by mouth daily.    [provider]  JANUVIA 100 MG tablet Take 100 mg by mouth daily. 10/03/16   [provider]  loratadine (CLARITIN) 10 MG tablet Take 10 mg by mouth every morning.    [provider]  meloxicam (MOBIC) 15 MG tablet Take 15 mg by mouth daily.  12/04/11   [provider]  Meth-Hyo-M Bl-Na Phos-Ph Sal (URO-MP ) 118 MG CAPS Take 1 capsule by mouth daily. 08/15/23   [provider]  methocarbamol  (ROBAXIN ) 500 MG tablet Take 1 tablet (500 mg total) by mouth every 8 (eight) hours as needed (for lower back and leg pain). 11/24/13   Joshua Alm Hamilton, MD  mometasone  (NASONEX) 50 MCG/ACT nasal spray Place 1 spray into both nostrils daily.    [provider]  montelukast  (SINGULAIR ) 10 MG tablet Take 10 mg by mouth at bedtime.    [provider]  Multiple Vitamins-Minerals (CENTRUM SILVER PO) Take 1 tablet by mouth daily.    [provider]  nystatin  (MYCOSTATIN ) 100000 UNIT/ML suspension Take 5 mLs (500,000 Units total) by mouth 4 (four) times daily. Patient not taking: Reported on 09/28/2023 06/03/22   Gladis Leonor HERO, MD  ondansetron  (ZOFRAN ) 8 MG tablet Take 8 mg by mouth every 8 (eight) hours as needed for nausea or vomiting.    [provider]  oxymetazoline (AFRIN) 0.05 %  nasal spray Place 1 spray into both nostrils at bedtime.    [provider]  pantoprazole  (PROTONIX ) 40 MG tablet Take 40 mg by mouth daily. 08/07/23   [provider]  pentosan polysulfate (ELMIRON) 100 MG capsule Take 200 mg by mouth in the morning and at bedtime.    [provider]  psyllium (REGULOID) 0.52 G capsule Take 0.52 g by mouth daily.    [provider]  rosuvastatin (CRESTOR) 5 MG tablet Take 5 mg by mouth at bedtime.    [provider]  tadalafil (CIALIS) 5 MG tablet Take 5 mg by mouth at bedtime. 05/16/22  [provider]  tobramycin -dexamethasone  (TOBRADEX ) ophthalmic ointment Place 1 application into the right eye 2 (two) times daily. Patient not taking: Reported on 09/28/2023 10/20/14   Neysa Fallow, MD  TRELEGY ELLIPTA 100-62.5-25 MCG/ACT AEPB Inhale 1 puff into the lungs daily.    [provider]    Family History Family History  Problem Relation Age of Onset   Cancer Mother        colon    Social History Social History   Tobacco Use   Smoking status: Former    Current packs/day: 0.00    Average packs/day: 0.3 packs/day for 10.0 years (2.5 ttl pk-yrs)    Types: Cigarettes    Start date: 01/07/1974    Quit date: 01/08/1984    Years since quitting: 40.0   Smokeless tobacco: Former    Types: Snuff    Quit date: 1980  Vaping Use   Vaping status: Never Used  Substance Use Topics   Alcohol  use: Not Currently    Comment: occ beer   Drug use: No     Allergies   Penicillins   Review of Systems Review of Systems  Per HPI  Physical Exam Triage Vital Signs ED Triage Vitals  Encounter Vitals Group     BP 01/26/24 1845 (!) 148/84     Girls Systolic BP Percentile --      Girls Diastolic BP Percentile --      Boys Systolic BP Percentile --      Boys Diastolic BP Percentile --      Pulse Rate 01/26/24 1845 (!) 109     Resp 01/26/24 1845 19     Temp 01/26/24 1845 98.6 F (37 C)     Temp Source  01/26/24 1845 Oral     SpO2 01/26/24 1845 98 %     Weight 01/26/24 1845 139 lb (63 kg)     Height 01/26/24 1845 5' 9 (1.753 m)     Head Circumference --      Peak Flow --      Pain Score 01/26/24 1844 7     Pain Loc --      Pain Education --      Exclude from Growth Chart --    No data found.  Updated Vital Signs BP (!) 151/82 (BP Location: Left Arm)   Pulse 94   Temp 98.6 F (37 C) (Oral)   Resp 19   Ht 5' 9 (1.753 m)   Wt 139 lb (63 kg)   SpO2 98%   BMI 20.53 kg/m   Visual Acuity Right Eye Distance:   Left Eye Distance:   Bilateral Distance:    Right Eye Near:   Left Eye Near:    Bilateral Near:     Physical Exam Vitals and nursing note reviewed.  Constitutional:      Appearance: Normal appearance.  HENT:     Head: Normocephalic and atraumatic.     Right Ear: External ear normal.     Left Ear: External ear normal.     Nose: Congestion and rhinorrhea present.     Mouth/Throat:     Mouth: Mucous membranes are moist.     Pharynx: Posterior oropharyngeal erythema present.  Eyes:     Conjunctiva/sclera: Conjunctivae normal.  Cardiovascular:     Rate and Rhythm: Normal rate and regular rhythm.     Heart sounds: Normal heart sounds. No murmur heard. Pulmonary:     Effort: Pulmonary effort is normal. No respiratory distress.  Breath sounds: Normal breath sounds. No wheezing.  Musculoskeletal:        General: Normal range of motion.  Skin:    General: Skin is warm and dry.  Neurological:     General: No focal deficit present.     Mental Status: He is alert and oriented to person, place, and time.  Psychiatric:        Mood and Affect: Mood normal.        Behavior: Behavior normal.      UC Treatments / Results  Labs (all labs ordered are listed, but only abnormal results are displayed) Labs Reviewed  POC COVID19/FLU A&B COMBO  POCT RESPIRATORY SYNCYTIAL VIRUS    EKG   Radiology DG Chest 2 View Result Date: 01/26/2024 EXAM: 2 VIEW(S) XRAY  OF THE CHEST 01/26/2024 07:14:53 PM COMPARISON: 06/03/2022 CLINICAL HISTORY: cough x4 days FINDINGS: LUNGS AND PLEURA: No focal pulmonary opacity. No pleural effusion. No pneumothorax. Mild elevation of right hemidiaphragm. HEART AND MEDIASTINUM: No acute abnormality of the cardiac and mediastinal silhouettes. BONES AND SOFT TISSUES: No acute osseous abnormality. Partially visualized cervical spinal fusion hardware in place. Surgical clips in right upper quadrant. IMPRESSION: 1. No acute cardiopulmonary disease to explain cough. Electronically signed by: Oneil Devonshire MD 01/26/2024 07:22 PM EST RP Workstation: [id number]    Procedures Procedures (including critical care time)  Medications Ordered in UC Medications  dexamethasone  (DECADRON ) injection 10 mg (has no administration in time range)    Initial Impression / Assessment and Plan / UC Course  I have reviewed the triage vital signs and the nursing notes.  Pertinent labs & imaging results that were available during my care of the patient were reviewed by me and considered in my medical decision making (see chart for details).  Vitals and triage reviewed, patient is hemodynamically stable.  Lungs vesicular, heart with regular rate and rhythm.  Congestion, rhinorrhea and postnasal drip present on physical exam.  Chest x-ray obtained due to dyspnea and shortness of breath.  Dyspnea does appear to be chronic but is worse than usual.  Chest x-ray by my interpretation shows elevation of the right hemidiaphragm, but no acute changes.  Confirmed with radiology overread.  COVID and flu testing negative.  RSV testing negative as well, suspect other viral illness.  IM Decadron  given in clinic for dyspnea.  Patient is a type II diabetic with good control on Jardiance.  Plan of care, follow-up care return precautions given, no questions at this time.    Final Clinical Impressions(s) / UC Diagnoses   Final diagnoses:  Dyspnea, unspecified type   Viral URI with cough     Discharge Instructions      Chest x-ray did not show evidence of pneumonia.  COVID, flu and RSV testing were all negative.  We have given you a steroid injection to help with inflammation in the lungs.  You can take over-the-counter Mucinex 1200 mg daily to help loosen secretions in addition to at least 64 ounces of water daily.  Take the Tessalon  Perles as needed to help with cough suppression, this might not be covered by your insurance.  Symptoms should improve over the next 5 to 7 days, if no improvement or any changes follow-up with your primary care provider or return to clinic for reevaluation.     ED Prescriptions     Medication Sig Dispense Auth. Provider   benzonatate  (TESSALON ) 100 MG capsule Take 1 capsule (100 mg total) by mouth every 8 (eight) hours. 21 capsule  Dreama, Daryn Pisani  N, FNP      PDMP not reviewed this encounter.   Dreama Ronalee SAILOR, FNP 01/26/24 1939

## 2024-02-09 DIAGNOSIS — M7061 Trochanteric bursitis, right hip: Secondary | ICD-10-CM | POA: Diagnosis not present

## 2024-02-09 DIAGNOSIS — M4316 Spondylolisthesis, lumbar region: Secondary | ICD-10-CM | POA: Diagnosis not present

## 2024-02-10 ENCOUNTER — Other Ambulatory Visit: Payer: Self-pay | Admitting: Neurological Surgery

## 2024-02-10 DIAGNOSIS — M4316 Spondylolisthesis, lumbar region: Secondary | ICD-10-CM

## 2024-02-18 ENCOUNTER — Inpatient Hospital Stay: Admission: RE | Admit: 2024-02-18

## 2024-02-18 DIAGNOSIS — M4316 Spondylolisthesis, lumbar region: Secondary | ICD-10-CM

## 2024-02-22 ENCOUNTER — Other Ambulatory Visit (HOSPITAL_BASED_OUTPATIENT_CLINIC_OR_DEPARTMENT_OTHER): Payer: Self-pay | Admitting: Neurological Surgery

## 2024-02-22 DIAGNOSIS — M79604 Pain in right leg: Secondary | ICD-10-CM

## 2024-02-26 ENCOUNTER — Encounter (HOSPITAL_BASED_OUTPATIENT_CLINIC_OR_DEPARTMENT_OTHER): Payer: Self-pay

## 2024-02-26 ENCOUNTER — Ambulatory Visit (HOSPITAL_BASED_OUTPATIENT_CLINIC_OR_DEPARTMENT_OTHER)
Admission: RE | Admit: 2024-02-26 | Discharge: 2024-02-26 | Disposition: A | Discharge status: Home / Self Care | Source: Ambulatory Visit | Attending: Neurological Surgery | Admitting: Neurological Surgery

## 2024-02-26 DIAGNOSIS — M79604 Pain in right leg: Secondary | ICD-10-CM

## 2024-03-10 ENCOUNTER — Ambulatory Visit (HOSPITAL_BASED_OUTPATIENT_CLINIC_OR_DEPARTMENT_OTHER)
Admission: RE | Admit: 2024-03-10 | Discharge: 2024-03-10 | Disposition: A | Discharge status: Home / Self Care | Source: Ambulatory Visit | Attending: Neurological Surgery | Admitting: Neurological Surgery

## 2024-03-10 DIAGNOSIS — M79604 Pain in right leg: Secondary | ICD-10-CM | POA: Diagnosis present

## 2024-03-10 DIAGNOSIS — M79605 Pain in left leg: Secondary | ICD-10-CM | POA: Insufficient documentation

## 2024-11-02 ENCOUNTER — Encounter (INDEPENDENT_AMBULATORY_CARE_PROVIDER_SITE_OTHER): Admitting: Ophthalmology
